# Patient Record
Sex: Female | Born: 1935 | Race: White | Hispanic: No | Smoking: Never smoker
Health system: Southern US, Community
[De-identification: ages and names within clinical notes are randomized; demographics above are authoritative.]

---

## 2004-08-25 ENCOUNTER — Other Ambulatory Visit: Payer: Self-pay

## 2004-08-25 ENCOUNTER — Emergency Department: Payer: Self-pay | Admitting: Emergency Medicine

## 2005-02-04 ENCOUNTER — Ambulatory Visit: Payer: Self-pay | Admitting: Psychiatry

## 2005-06-20 ENCOUNTER — Ambulatory Visit: Payer: Self-pay | Admitting: Family Medicine

## 2005-12-02 ENCOUNTER — Other Ambulatory Visit: Payer: Self-pay

## 2005-12-03 ENCOUNTER — Inpatient Hospital Stay: Payer: Self-pay | Admitting: Internal Medicine

## 2005-12-27 ENCOUNTER — Emergency Department: Payer: Self-pay | Admitting: General Practice

## 2006-02-04 ENCOUNTER — Ambulatory Visit: Payer: Self-pay | Admitting: Nephrology

## 2006-04-17 ENCOUNTER — Other Ambulatory Visit: Payer: Self-pay

## 2006-04-17 ENCOUNTER — Emergency Department: Payer: Self-pay | Admitting: Emergency Medicine

## 2006-07-03 ENCOUNTER — Ambulatory Visit: Payer: Self-pay | Admitting: Family Medicine

## 2007-05-01 ENCOUNTER — Emergency Department: Payer: Self-pay | Admitting: General Practice

## 2007-09-02 ENCOUNTER — Ambulatory Visit: Payer: Self-pay | Admitting: Family Medicine

## 2008-09-13 ENCOUNTER — Ambulatory Visit: Payer: Self-pay | Admitting: Family Medicine

## 2008-10-10 ENCOUNTER — Emergency Department: Payer: Self-pay | Admitting: Emergency Medicine

## 2010-07-17 ENCOUNTER — Ambulatory Visit: Payer: Self-pay | Admitting: Family Medicine

## 2011-04-04 ENCOUNTER — Emergency Department: Payer: Self-pay | Admitting: Emergency Medicine

## 2012-08-25 ENCOUNTER — Ambulatory Visit: Payer: Self-pay | Admitting: Nephrology

## 2012-09-04 ENCOUNTER — Ambulatory Visit: Payer: Self-pay | Admitting: Nephrology

## 2012-10-04 ENCOUNTER — Ambulatory Visit: Payer: Self-pay | Admitting: Nephrology

## 2013-01-14 ENCOUNTER — Ambulatory Visit: Payer: Self-pay | Admitting: Primary Care

## 2013-01-23 ENCOUNTER — Emergency Department: Payer: Self-pay | Admitting: Emergency Medicine

## 2013-01-23 LAB — CBC WITH DIFFERENTIAL/PLATELET
Basophil %: 0.7 %
Eosinophil %: 0.8 %
HCT: 42.1 % (ref 35.0–47.0)
HGB: 13.8 g/dL (ref 12.0–16.0)
Lymphocyte #: 1 10*3/uL (ref 1.0–3.6)
Lymphocyte %: 14.4 %
MCH: 30.4 pg (ref 26.0–34.0)
MCHC: 32.7 g/dL (ref 32.0–36.0)
Monocyte #: 0.4 x10 3/mm (ref 0.2–0.9)
Neutrophil #: 5.4 10*3/uL (ref 1.4–6.5)
RBC: 4.53 10*6/uL (ref 3.80–5.20)

## 2013-01-23 LAB — COMPREHENSIVE METABOLIC PANEL
Anion Gap: 4 — ABNORMAL LOW (ref 7–16)
Chloride: 102 mmol/L (ref 98–107)
Co2: 29 mmol/L (ref 21–32)
EGFR (African American): 35 — ABNORMAL LOW
Osmolality: 282 (ref 275–301)
Potassium: 3.8 mmol/L (ref 3.5–5.1)
SGPT (ALT): 24 U/L (ref 12–78)
Sodium: 135 mmol/L — ABNORMAL LOW (ref 136–145)
Total Protein: 7.2 g/dL (ref 6.4–8.2)

## 2013-01-23 LAB — URINALYSIS, COMPLETE
Bacteria: NONE SEEN
Bilirubin,UR: NEGATIVE
Blood: NEGATIVE
Glucose,UR: >=500 mg/dL (ref 0–75)
Hyaline Cast: 1
Ketone: NEGATIVE
Nitrite: NEGATIVE
Ph: 7 (ref 4.5–8.0)
RBC,UR: 1 /HPF (ref 0–5)
Squamous Epithelial: 1
WBC UR: 3 /HPF (ref 0–5)

## 2013-02-02 ENCOUNTER — Emergency Department: Payer: Self-pay | Admitting: Emergency Medicine

## 2013-04-01 ENCOUNTER — Ambulatory Visit: Payer: Self-pay | Admitting: Vascular Surgery

## 2013-04-07 ENCOUNTER — Ambulatory Visit: Payer: Self-pay | Admitting: Vascular Surgery

## 2013-04-07 LAB — BASIC METABOLIC PANEL
Anion Gap: 4 — ABNORMAL LOW (ref 7–16)
BUN: 24 mg/dL — ABNORMAL HIGH (ref 7–18)
Calcium, Total: 9.5 mg/dL (ref 8.5–10.1)
Chloride: 106 mmol/L (ref 98–107)
Co2: 29 mmol/L (ref 21–32)
EGFR (African American): 39 — ABNORMAL LOW
Osmolality: 286 (ref 275–301)
Potassium: 3.7 mmol/L (ref 3.5–5.1)
Sodium: 139 mmol/L (ref 136–145)

## 2013-04-20 ENCOUNTER — Ambulatory Visit: Payer: Self-pay | Admitting: Vascular Surgery

## 2013-04-20 LAB — CBC
HCT: 39.6 % (ref 35.0–47.0)
HGB: 13.5 g/dL (ref 12.0–16.0)
MCH: 31.2 pg (ref 26.0–34.0)
MCV: 92 fL (ref 80–100)
RBC: 4.31 10*6/uL (ref 3.80–5.20)

## 2013-04-20 LAB — BASIC METABOLIC PANEL
BUN: 29 mg/dL — ABNORMAL HIGH (ref 7–18)
Calcium, Total: 9.5 mg/dL (ref 8.5–10.1)
Chloride: 105 mmol/L (ref 98–107)
Creatinine: 1.78 mg/dL — ABNORMAL HIGH (ref 0.60–1.30)
EGFR (Non-African Amer.): 26 — ABNORMAL LOW
Glucose: 235 mg/dL — ABNORMAL HIGH (ref 65–99)
Osmolality: 289 (ref 275–301)

## 2013-04-28 ENCOUNTER — Inpatient Hospital Stay: Payer: Self-pay | Admitting: Vascular Surgery

## 2013-04-29 LAB — CBC WITH DIFFERENTIAL/PLATELET
Basophil #: 0 10*3/uL (ref 0.0–0.1)
Basophil %: 0.3 %
Eosinophil #: 0 10*3/uL (ref 0.0–0.7)
Eosinophil %: 0.4 %
HCT: 36.8 % (ref 35.0–47.0)
Lymphocyte #: 1 10*3/uL (ref 1.0–3.6)
Lymphocyte %: 10.1 %
MCH: 30.7 pg (ref 26.0–34.0)
Monocyte #: 0.6 x10 3/mm (ref 0.2–0.9)
Monocyte %: 6.2 %
Platelet: 259 10*3/uL (ref 150–440)
WBC: 9.4 10*3/uL (ref 3.6–11.0)

## 2013-04-29 LAB — APTT: Activated PTT: 34.6 secs (ref 23.6–35.9)

## 2013-04-29 LAB — PROTIME-INR: Prothrombin Time: 14.4 secs (ref 11.5–14.7)

## 2013-04-29 LAB — BASIC METABOLIC PANEL
BUN: 13 mg/dL (ref 7–18)
Calcium, Total: 8.3 mg/dL — ABNORMAL LOW (ref 8.5–10.1)
Co2: 28 mmol/L (ref 21–32)
EGFR (Non-African Amer.): 44 — ABNORMAL LOW
Osmolality: 281 (ref 275–301)
Potassium: 3.3 mmol/L — ABNORMAL LOW (ref 3.5–5.1)
Sodium: 139 mmol/L (ref 136–145)

## 2013-04-30 LAB — PATHOLOGY REPORT

## 2013-08-15 LAB — COMPREHENSIVE METABOLIC PANEL
Alkaline Phosphatase: 92 U/L (ref 50–136)
Bilirubin,Total: 0.4 mg/dL (ref 0.2–1.0)
Calcium, Total: 9.4 mg/dL (ref 8.5–10.1)
Chloride: 105 mmol/L (ref 98–107)
Co2: 27 mmol/L (ref 21–32)
EGFR (Non-African Amer.): 29 — ABNORMAL LOW
Glucose: 173 mg/dL — ABNORMAL HIGH (ref 65–99)
Osmolality: 282 (ref 275–301)
Potassium: 3.9 mmol/L (ref 3.5–5.1)
SGPT (ALT): 19 U/L (ref 12–78)

## 2013-08-15 LAB — CBC
HGB: 13.4 g/dL (ref 12.0–16.0)
MCH: 31 pg (ref 26.0–34.0)
MCHC: 34.2 g/dL (ref 32.0–36.0)
MCV: 91 fL (ref 80–100)

## 2013-08-15 LAB — URINALYSIS, COMPLETE
Bilirubin,UR: NEGATIVE
Glucose,UR: 150 mg/dL (ref 0–75)
Ketone: NEGATIVE
Leukocyte Esterase: NEGATIVE
RBC,UR: <1 /HPF (ref 0–5)
Squamous Epithelial: 1
WBC UR: <1 /HPF (ref 0–5)

## 2013-08-15 LAB — TROPONIN I: Troponin-I: <0.02 ng/mL

## 2013-08-16 ENCOUNTER — Inpatient Hospital Stay: Payer: Self-pay | Admitting: Internal Medicine

## 2013-08-16 LAB — LIPID PANEL
Cholesterol: 130 mg/dL (ref 0–200)
HDL Cholesterol: 51 mg/dL (ref 40–60)
VLDL Cholesterol, Calc: 27 mg/dL (ref 5–40)

## 2013-08-16 LAB — BASIC METABOLIC PANEL
Anion Gap: 9 (ref 7–16)
BUN: 26 mg/dL — ABNORMAL HIGH (ref 7–18)
Calcium, Total: 9.3 mg/dL (ref 8.5–10.1)
Chloride: 108 mmol/L — ABNORMAL HIGH (ref 98–107)
Co2: 24 mmol/L (ref 21–32)
EGFR (African American): 39 — ABNORMAL LOW
Glucose: 158 mg/dL — ABNORMAL HIGH (ref 65–99)

## 2013-08-16 LAB — CBC WITH DIFFERENTIAL/PLATELET
Basophil #: 0 10*3/uL (ref 0.0–0.1)
Eosinophil #: 0.1 10*3/uL (ref 0.0–0.7)
Eosinophil %: 1.5 %
HCT: 38.3 % (ref 35.0–47.0)
HGB: 13 g/dL (ref 12.0–16.0)
MCH: 30.7 pg (ref 26.0–34.0)
MCHC: 34 g/dL (ref 32.0–36.0)
MCV: 91 fL (ref 80–100)
Monocyte #: 0.4 x10 3/mm (ref 0.2–0.9)
Monocyte %: 8.1 %
Neutrophil %: 72.3 %
Platelet: 250 10*3/uL (ref 150–440)
RBC: 4.23 10*6/uL (ref 3.80–5.20)
RDW: 15 % — ABNORMAL HIGH (ref 11.5–14.5)

## 2014-05-18 ENCOUNTER — Ambulatory Visit: Payer: Self-pay | Admitting: Primary Care

## 2014-05-19 ENCOUNTER — Encounter: Payer: Self-pay | Admitting: Internal Medicine

## 2014-06-04 ENCOUNTER — Encounter: Payer: Self-pay | Admitting: Internal Medicine

## 2014-07-05 ENCOUNTER — Encounter: Payer: Self-pay | Admitting: Internal Medicine

## 2014-08-04 ENCOUNTER — Encounter: Payer: Self-pay | Admitting: Internal Medicine

## 2015-02-24 NOTE — Discharge Summary (Signed)
PATIENT NAME:  Joan Cisneros, Joan Cisneros MR#:  [id number] DATE OF BIRTH:  [birth year]  DISCHARGING PHYSICIAN: Enid Baas, M.D.   PRIMARY CARE PHYSICIAN: Sandrea Hughs, N.P.  CONSULTATIONS IN THE HOSPITAL: None.   DISCHARGE DIAGNOSES:  1.  Right facial droop, possibly transient ischemic attack, which is resolved.  2.  Hypertension.  3.  Diabetes.  4.  Chronic kidney disease.  5.  Hypothyroidism.  6.  Peripheral vascular disease.  7.  Status post recent right carotid endarterectomy.   DISCHARGE HOME MEDICATIONS:  Nortriptyline 25 mg p.o. at bedtime.  Plavix 75 daily.  Norvasc 2.5 mg p.o. daily.  Lovastatin 40 mg p.o. daily.  Actos 30 mg p.o. daily.  Glipizide 20 mg p.o. b.i.d.  Hydrochlorothiazide 25 mg p.o. daily.  Meloxicam 7.5 mg p.o. daily p.r.n. for pain.  Colace 100 mg p.o. b.i.d. p.r.n. for constipation.  Aspirin 81 mg p.o. daily.  Synthroid 88 mcg p.o. daily.   DISCHARGE DIET: Low-sodium and ADA diet.   DISCHARGE ACTIVITY: As tolerated.   FOLLOWUP INSTRUCTIONS: PCP follow-up in 1 to 2 weeks.   LABORATORIES AND IMAGING STUDIES PRIOR TO DISCHARGE: WBC 4.7, hemoglobin 13.0, hematocrit 38.3, platelet count 215.   Sodium 141, potassium 3.5, chloride 108, bicarb 24, BUN 26, creatinine 1.45, glucose 158 and calcium of 9.3.   HbA1c 6.9. TSH was low at 0.149 and free T4 was slightly elevated at 1.47.   Ultrasound carotid bilaterally revealing no evidence of any hemodynamically significant stenosis. Small calcified plaque on the right side is present secondary to previous endarterectomy. On the left side also small to moderate amount of plaque is seen diffusely. CT of the head without contrast on admission showing no evidence of any ischemic or hemorrhagic infarction.   Urinalysis was negative for any infection.   MRI of the brain without contrast showing no focal abnormal. No evidence of acute ischemia. Echo Doppler revealing normal LV ejection fraction, EF of 60% to 65%.  Impaired relaxation pattern of LV diastolic filling noted.   BRIEF HOSPITAL COURSE: Joan Cisneros is a pleasant 81 year old Caucasian female with past medical history significant for hypertension, diabetes, chronic kidney disease, who had a recent right carotid endarterectomy done for high-grade stenosis. She presents to the hospital for a transient episode of weakness and right facial droop.  1.  Possible TIA. Was admitted to telemetry. Neuro checks remained stable. The patient's symptoms resolved in less than 24 hours. MRI was negative for any infarct. She was already taking aspirin and statin which were continued. Her carotid Doppler showed post surgical changes on the right side and the left side, small to moderate plaque being present. She was recommended to follow up with vascular as an outpatient for her left carotid small to moderate plaque. The patient was already on Plavix which was continued at this time. The patient had an echocardiogram which seemed to be normal so no medication changes were done.  2.  Hypothyroidism. The patient was on Synthroid; however, her TSH was low and T4 was  slightly elevated, so her Synthroid dose was decreased from 100 mcg to 88 mcg prior to discharge.  3.  Hypertension and diabetes were stable during the hospital course, so her home medications were continued without any changes. Her course has been otherwise uneventful in the hospital.   DISCHARGE CONDITION: Stable.   DISCHARGE DISPOSITION: Home.   TIME SPENT ON DISCHARGE: 40 minutes.    ____________________________ Enid Baas, MD rk:np D: 08/18/2013 15:14:28 ET T: 08/18/2013 19:11:35 ET JOB#: [id number]  cc: Enid Baasadhika Yekaterina Escutia, MD, <Dictator> Enid BaasADHIKA Shenicka Sunderlin MD ELECTRONICALLY SIGNED 08/21/2013 12:04

## 2015-02-24 NOTE — Op Note (Signed)
PATIENT NAME:  Joan Cisneros, [name] MR#:  [id number] DATE OF BIRTH:  26-Dec-1930  DATE OF PROCEDURE:  04/07/2013  PREOPERATIVE DIAGNOSES:  1.  Carotid artery stenosis.  2.  Syncope.  3.  Chronic kidney disease precluding CT angiogram.  4.  Hypertension.  5.  Hyperlipidemia.  6.  Diabetes.   POSTOPERATIVE DIAGNOSES: 1.  Carotid artery stenosis.  2.  Syncope.  3.  Chronic kidney disease precluding CT angiogram.  4.  Hypertension.  5.  Hyperlipidemia.  6.  Diabetes.   PROCEDURES:  1.  Catheter placement to right common carotid artery from right femoral approach.  2.  Thoracic aortogram and selective cervical and cerebral right carotid angiogram.  3.  StarClose closure device, right femoral artery.   SURGEON: Annice NeedyJason S Tripp Goins, M.D.   ANESTHESIA: Local with moderate conscious sedation.   ESTIMATED BLOOD LOSS: Minimal.   FLUOROSCOPY TIME: Approximately 2 minutes.   CONTRAST USED: 37 mL.   INDICATION FOR PROCEDURE: This is an 81 year old white female with ultrasound suggesting high-grade right carotid artery stenosis. She could not have a CT scan due to her diminished creatinine clearance and a catheter-based angiogram performed with a limited amount of contrast is performed for further evaluation to delineate the best treatment options. Risks and benefits were discussed. Informed consent was obtained.   DESCRIPTION OF PROCEDURE: The patient is brought to the vascular and interventional radiology suite. Groins were sterilely prepped and draped, and a sterile surgical field was created. The right femoral head was localized with fluoroscopy and the right femoral artery was accessed without difficulty with a Seldinger needle. A J-wire was placed and a 5-French sheath was placed over the wire. Pigtail catheter was placed in the ascending aorta and LAO projection and thoracic aortogram was performed. This demonstrated an approximately 70% stenosis at the origin of the left common carotid artery. The  visualization of the left cervical carotid artery did not appear to show any flow-limiting stenosis and I did not want to selectively cannulate this; not only to avoid cannulation of the proximal lesion and its attended risk of thromboembolization, but also to limit contrast due to her chronic kidney disease. I then used a Headhunter catheter to selectively cannulate the innominate artery without difficulty and advanced into the right common carotid artery. Cervical and cerebral right carotid angiography was then performed. This demonstrated an approximately 90% right ICA stenosis in the proximal portion of the ICA. This was a reasonably long lesion that was 15 to 20 mm in length in the ICA. The intracerebral flow showed a brisk filling of the middle cerebral artery with no filling of the anterior cerebral artery. At this point, I elected to terminate the procedure. The diagnostic catheter was removed. AP and lateral cerebral shots and LAO, RAO and lateral cervical shots were performed for evaluation. StarClose closure device was then deployed in the right femoral artery with excellent hemostatic result. The patient tolerated the procedure well and was taken to the recovery room in stable condition.    ____________________________ Annice NeedyJason S. Sema Stangler, MD jsd:aw D: 04/07/2013 11:13:19 ET T: 04/07/2013 11:45:10 ET JOB#: [id number]  cc: Annice NeedyJason S. Saman Giddens, MD, <Dictator> Annice NeedyJASON S Anagabriela Jokerst MD ELECTRONICALLY SIGNED 04/14/2013 10:58

## 2015-02-24 NOTE — Discharge Summary (Signed)
PATIENT NAME:  Joan Cisneros, Joan Cisneros MR#:  [id number] DATE OF BIRTH:  [birth year]  DATE OF ADMISSION:  04/28/2013 DATE OF DISCHARGE:  04/29/2013  ADMITTING DIAGNOSES: High-grade right carotid artery stenosis, moderate left common carotid artery stenosis, hypertension, diabetes, chronic kidney disease.   DISCHARGE DIAGNOSES: High-grade right carotid artery stenosis, moderate left common carotid artery stenosis, hypertension, diabetes, chronic kidney disease.   CONSULTS: None.   HOSPITAL PROCEDURES: Right carotid endarterectomy with CorMatrix arterial reconstruction of the carotid artery.   BRIEF HISTORY: The patient is an 81 year old white female who was found to have high-grade carotid artery stenosis, also with stenosis of the left common carotid artery. She did not have any neuro symptoms prior to the surgery.   HOSPITAL COURSE: The patient was admitted through same day surgery and taken to the Operating Room for right carotid endarterectomy, which was performed by Dr. Wyn Quakerew. She did well following the procedure and was admitted to the Critical Care Unit where she was closely monitored. She did well after the procedure. She had no neurologic changes. Blood pressure was well controlled. On postop day 1, she was tolerating diet, able to ambulate without issues, and again without any neurologic changes. At this time, she was ready for discharge.   DISCHARGE INSTRUCTIONS: Low-fat, low-cholesterol diet. No exertional activity. Follow up for wound check in 2 weeks.   DISCHARGE MEDICATIONS: Please see the patient's chart, which does include aspirin and Plavix. She was given Cisneros prescription for Norco as well. She also was discharged on lovastatin, which she had previously taken at home. For more information, please see the patient's chart.   ____________________________ Hoyle Sauerhelsea N. Dorice Stiggers, PA-C cnh:jm D: 05/03/2013 17:36:36 ET T: 05/03/2013 20:56:00 ET JOB#: [id number]  cc: Hoyle Sauerhelsea N. Clytee Heinrich, PA-C,  <Dictator> Anitha Kreiser N Cattleya Dobratz PA ELECTRONICALLY SIGNED 05/14/2013 17:50

## 2015-02-24 NOTE — H&P (Signed)
PATIENT NAME:  Joan Cisneros, Zameria A MR#:  [id number] DATE OF BIRTH:  1931/05/25  DATE OF ADMISSION:  08/15/2013  PRIMARY CARE PHYSICIAN:  Sandrea HughsJessica Rubio, NP   REFERRING PHYSICIAN:  Dr. Lenard LancePaduchowski  CHIEF COMPLAINT:  Right facial droop, weakness and dysarthria.   HISTORY OF PRESENT ILLNESS: The patient is an 81 year old, pleasant Caucasian female, with past medical history of hypertension, diabetes mellitus, chronic kidney disease and hypothyroidism, with a recent history of high-grade right carotid artery stenosis, status post right carotid endarterectomy. She is presenting to the ER with a chief complaint of generalized weakness associated with dysarthria and right facial droop since this morning. According to the granddaughter, patient was feeling weak and tired, and called her granddaughter. By the time the granddaughter went there, patient was quite pale and shivering. The granddaughter also noted right facial droop. The patient's speech was not quite clear. She denies any difficulty swallowing. Denies any headache or blurry vision. She had right-sided weakness transiently, which has resolved. Eventually, dysarthria is also completely resolved. In the ER, patient still had right facial droop and generalized weakness. Complaining of dizziness, but denies any loss of consciousness. Denies any chest pain or shortness of breath. CAT scan of the head has revealed no acute findings. Hospitalist team is called to admit the patient, as patient has a recent history of high-grade right carotid artery stenosis, and got carotid endarterectomy done in June 2014. The patient also does have moderate left carotid artery stenosis, which is concerning for the patient's presenting picture.  PAST MEDICAL HISTORY: Hypertension, diabetes mellitus, chronic renal insufficiency, hypothyroidism.   PAST SURGICAL HISTORY:  Right carotid endarterectomy.  ALLERGIES:  She has no known drug allergies.   PSYCHOSOCIAL HISTORY:  Lives at home. Lives alone. Denies any history of smoking, alcohol or drugs.   FAMILY HISTORY: Dad deceased with heart attack at age 79. Mom was healthy and lived until age 79.   HOME MEDICATIONS:  Plavix 75 mg once daily, pioglitazone 30 mg once daily, nortriptyline 25 mg once daily, lovastatin 40 mg once daily, levothyroxine  100 mcg once daily, hydrochlorothiazide 25 mg once daily as-needed basis, Glipizide 10 mg 2 tablets p.o. 2 times a day, aspirin 81 mg once daily, amlodipine 2.5 mg once daily.   REVIEW OF SYSTEMS:  CONSTITUTIONAL: Denies any fever, weight loss or weight gain. Complaining of fatigue.  EYES: Denies any blurry vision or eye pain.  ENT:  Denies any epistaxis or ear discharge.  RESPIRATORY:  Denies cough, COPD.   CARDIOVASCULAR: Denies chest pain, syncope. Complaining of dizziness.  GASTROINTESTINAL: Denies nausea, vomiting, diarrhea.  GENITOURINARY: No dysuria or hematuria.  GYN:  The patient denies breast mass or vaginal discharge.  ENDOCRINE: Denies polyuria, nocturia. Has chronic diabetes mellitus and hypothyroidism. HEMATOLOGIC AND LYMPHATIC: Denies anemia, easy bruising or bleeding.  INTEGUMENT: No acne, rash, lesions.  MUSCULOSKELETAL: No joint pain in the neck. Complaining of chronic right knee pain.  NEUROLOGIC: Denies any vertigo. Complaining of weakness and dysarthria, which has resolved. Still complaining of right facial droop. Denies any numbness.  PSYCHIATRIC: Denies any anxiety , depression  PHYSICAL EXAMINATION: VITAL SIGNS: Temperature 97.6, pulse 72, respirations 18, blood pressure 174/66, pulse ox 99%.  GENERAL APPEARANCE: Not in acute distress. Moderately built and nourished.  HEENT: Normocephalic, atraumatic. Pupils are equally reacting to light and accommodation. No scleral icterus. No conjunctival injection. Extraocular movements are intact. No sinus tenderness. No postnasal drip. Moist mucous membranes. Positive right facial droop.  NECK: Supple. No  JVD. No  thyromegaly. Range of motion is intact.  LUNGS: Clear to auscultation bilaterally. No accessory muscle use. There is no anterior chest wall tenderness on palpation.  CARDIAC:  S1, S2 normal. Regular rate and rhythm. No gallops. No clicks.  GASTROINTESTINAL: Soft. Bowel sounds are positive in all 4 quadrants. Nontender, nondistended. No hepatosplenomegaly. No masses felt.  NEUROLOGIC: Awake, alert, oriented x 3. Cranial nerves II to XII are grossly intact. No cerebellar signs. Finger-nose test is normal. No pronator drift. Upper and lower extremities motor and sensory are intact.  EXTREMITIES: No edema. No cyanosis. No clubbing. Reflexes are 2+.  MUSCULOSKELETAL: Positive right knee tenderness, which is chronic in nature. Otherwise no joint effusions or tenderness in other joints.  SKIN: Warm to touch. Decreased turgor because of age. No rashes. No lesions.  PSYCHIATRIC:  Normal mood and affect.  PULSES:  Distal pulses, dorsalis pedis and posterior tibialis are intact.   LABS AND IMAGING STUDIES: CAT scan of the head: No acute findings. A 12-lead EKG: Normal sinus rhythm at 81 beats per minute. Normal PR and QRS intervals. No ST-T wave changes. LFTs are normal. Troponin less than 0.02. CBC is normal. Urinalysis is normal. Glucose 173, BUN 24, creatinine 1.63, sodium 137, potassium 3.9, chloride 105, CO2 of 27. GFR 29, anion gap 5, serum osmolality 282, calcium 9.4.   ASSESSMENT AND PLAN: An 81 year old Caucasian female presenting to the ER with a chief complaint of generalized weakness, also with right facial droop and dysarthria since this morning. Will be admitted with the following assessment and plan:  Transient ischemic attack versus cerebrovascular accident, with history of moderate left carotid artery stenosis, status post right carotid endarterectomy in June 2014. Will admit her to telemetry.   Stroke workup with carotid Doppler on the left side, echocardiogram and MRI of the brain.    Will continue her home medications aspirin, Plavix and statin.   Will get neuro checks.   A bedside swallow evaluation by nursing. If swallowing function is intact, will start her on a low salt diabetic diet.   History of diabetes mellitus. Will resume her home medications if bedside swallow evaluation is normal. Will continue sliding scale insulin while patient is in the hospital for stress-induced hyperglycemia.   Hypertension. Resume her home medications.   Hypothyroidism. Continue Synthroid, if p.o. medications are resumed.    Will provide gastrointestinal and deep vein thrombosis prophylaxis.   She is FULL CODE. Granddaughter is her medical power of attorney.   Diagnosis and plan of care was discussed in detail with the patient and her granddaughter at bedside. They both verbalized understanding of the plan.   Total time spent on admission was 45 minutes.      ____________________________ Ramonita Lab, MD ag:mr D: 08/15/2013 19:25:51 ET T: 08/15/2013 19:45:52 ET JOB#: [id number]  cc: Ramonita Lab, MD, <Dictator> Sandrea Hughs, NP  Ramonita Lab MD ELECTRONICALLY SIGNED 09/03/2013 1:36

## 2015-02-24 NOTE — Op Note (Signed)
PATIENT NAME:  Joan Cisneros, Joan Cisneros MR#:  [id number] DATE OF BIRTH:  06-08-1931  DATE OF PROCEDURE:  04/28/2013  PREOPERATIVE DIAGNOSES:  1.  High-grade right carotid artery stenosis.  2.  Moderate left common carotid artery stenosis.  3.  Hypertension.  4.  Diabetes.  5.  Chronic kidney disease.   POSTOPERATIVE DIAGNOSES: 1.  High-grade right carotid artery stenosis.  2.  Moderate left common carotid artery stenosis.  3.  Hypertension.  4.  Diabetes.  5.  Chronic kidney disease.   PROCEDURE: Right carotid endarterectomy with CorMatrix arterial reconstruction of carotid artery.   SURGEON: Festus BarrenJason Trenity Pha, M.D.   ANESTHESIA: General.   ESTIMATED BLOOD LOSS: Approximately 50 mL.   INDICATION FOR PROCEDURE: This is an 81 year old white female with multiple medical issues. She was found to have carotid artery stenosis. She did not have clear focal symptoms, but she had Cisneros very high-grade right carotid artery stenosis of about 90%. She had some stenosis in her left common carotid artery at its origin of about 70%. Theses were seen on angiogram. Angiogram was done rather than CT scan due to her chronic kidney disease. For stroke risk reduction, we discussed options. She decided to proceed with right carotid endarterectomy. Risks and benefits were discussed including stroke, cardiopulmonary complications, bleeding, infection and cranial nerve injury. She desired to proceed.   DESCRIPTION OF PROCEDURE: The patient is brought to the operative suite, and after an adequate level of general anesthesia was obtained, the right neck and chest were sterilely prepped and draped and Cisneros sterile surgical field was created. She was placed in modified beach chair position. An incision was created along the anterior border of the sternocleidomastoid. It was dissected down to the platysma with electrocautery. Cisneros crossing vein was ligated with 0 silk ties. The facial vein was ligated and divided between silk ties, doubly  ligated on the jugular side. Cisneros couple of other crossing venous branches were ligated and divided between silk ties. This exposed the carotid artery. I encircled the common carotid artery, internal carotid artery, external carotid artery and superior thyroid artery with vessel loops. Control was pulled up on the vessel loops after the patient was systemically heparinized. This was allowed to circulate for about 5 minutes. An anterior arteriotomy was then created with an 11 blade and extended with Potts scissors. The Pruitt-[place] shunt was placed first in the internal carotid artery, flushed and de-aired, then in common carotid artery. Approximately 2 minutes elapsed from clamping to shunt placement.   I then performed endarterectomy in the usual fashion. The proximal endpoint was cut flush with tenotomy scissors. An eversion endarterectomy was performed on the external carotid artery and Cisneros nice feathered distal endpoint was treated with an internal carotid artery with gentle traction. The distal endpoint was tacked down with two 7-0 Prolene sutures and all loose flecks were removed and the vessel was locally heparinized. The CorMatrix extracellular patch was used to reconstruct the artery. The patch was cut and beveled distally and we ran approximately one half the length of the arteriotomy medially and laterally. The patch was then cut and beveled to an appropriate length proximally to match the arteriotomy, and Cisneros second 6-0 Prolene was started at the proximal endpoint. The medial suture line was run and tied together. The lateral suture line was run until the shunt needed to be removed. The shunt was then removed. The vessel was flushed in the internal, external and common carotid arteries and locally heparinized and then after completing  the arteriotomy with flushing through the external carotid artery and de-airing the vessels allowing several cardiac cycles to traverse up the external carotid artery prior to  release of the internal carotid artery. Approximately 2 to 3 minutes elapsed from the shunt removal to completion and restoration of flow through the internal carotid artery. Two 6-0 Prolene patches were used for hemostasis. Hemostasis was complete. The wound was then irrigated and Evicel was placed. The wound was then closed with 3 interrupted Vicryl sutures in the sternocleidomastoid space. The platysma was closed with Cisneros running 3-0 Vicryl and skin was closed with 4-0 Monocryl. Dermabond was placed as Cisneros dressing. The patient was then awakened from anesthesia and taken to the recovery room in stable condition, having tolerated the procedure well.    ____________________________ Annice Needy, MD jsd:aw D: 04/28/2013 10:11:25 ET T: 04/28/2013 11:12:13 ET JOB#: [id number]  cc: Annice Needy, MD, <Dictator> Sandrea Hughs, MD Annice Needy MD ELECTRONICALLY SIGNED 05/10/2013 13:17

## 2015-04-07 ENCOUNTER — Ambulatory Visit
Admission: RE | Admit: 2015-04-07 | Discharge: 2015-04-07 | Disposition: A | Discharge status: Home / Self Care | Payer: Medicare HMO | Source: Ambulatory Visit | Attending: Primary Care | Admitting: Primary Care

## 2015-04-07 ENCOUNTER — Other Ambulatory Visit: Payer: Self-pay | Admitting: Primary Care

## 2015-04-07 DIAGNOSIS — M85871 Other specified disorders of bone density and structure, right ankle and foot: Secondary | ICD-10-CM | POA: Diagnosis not present

## 2015-04-07 DIAGNOSIS — M79674 Pain in right toe(s): Secondary | ICD-10-CM | POA: Diagnosis present

## 2015-04-07 DIAGNOSIS — M869 Osteomyelitis, unspecified: Secondary | ICD-10-CM

## 2015-07-12 IMAGING — US US CAROTID DUPLEX BILAT
1 series · 13 of 24 positions shown · non-contrast
Comparison: none

REASON FOR EXAM: tia
COMMENTS:

[Series 1: us carotid duplex bilat · 0.09mm/px · 13 of 61 slices shown]
[im 1/61]
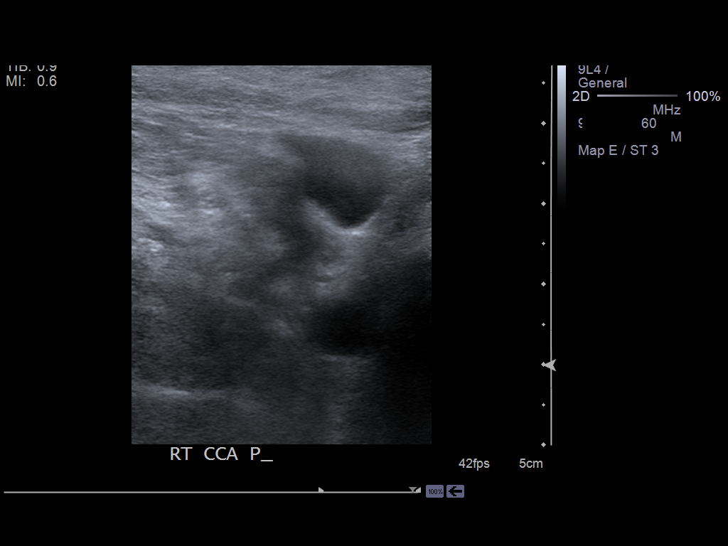
[im 6/61]
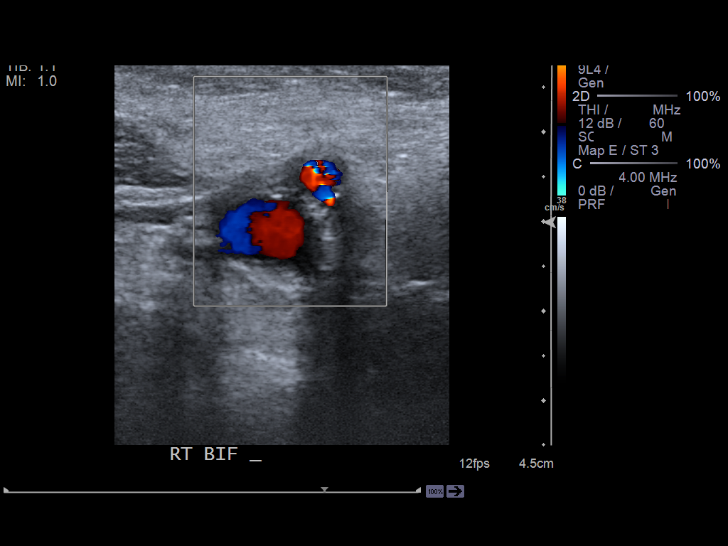
[im 11/61]
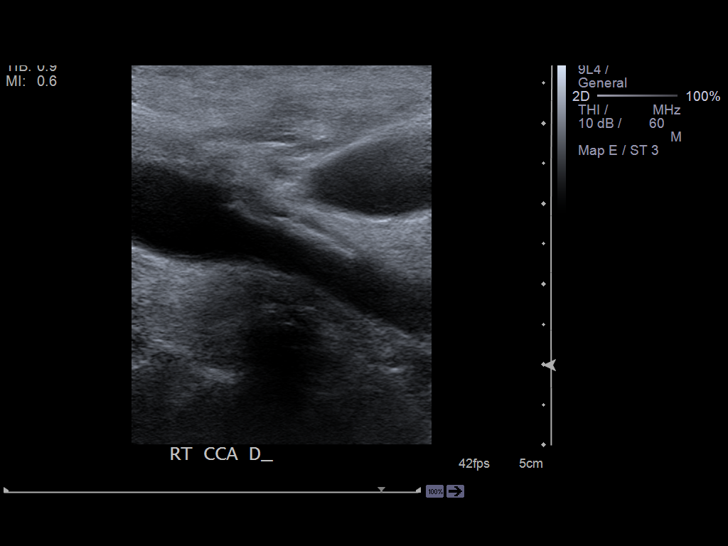
[im 16/61]
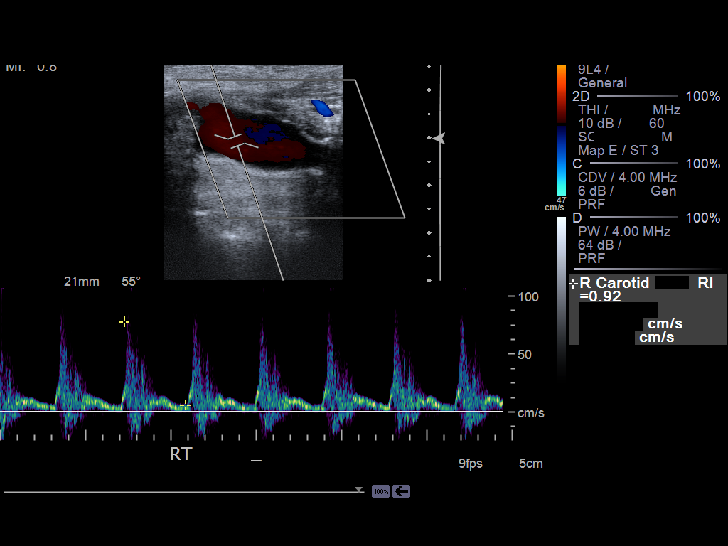
[im 21/61]
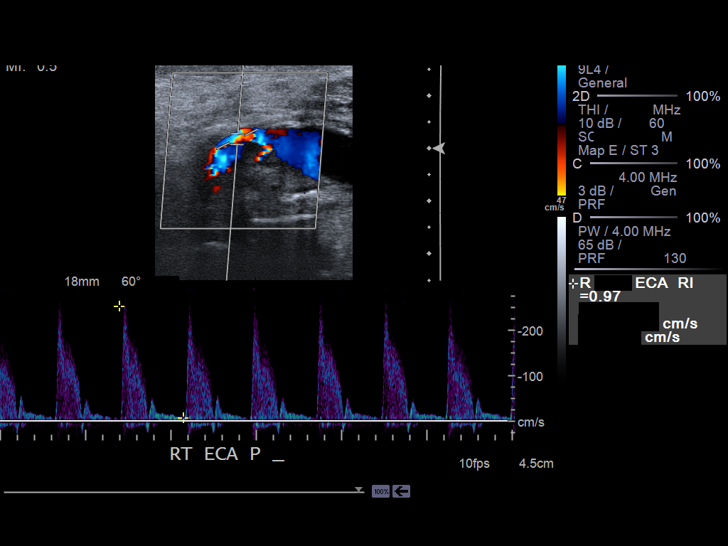
[im 27/61]
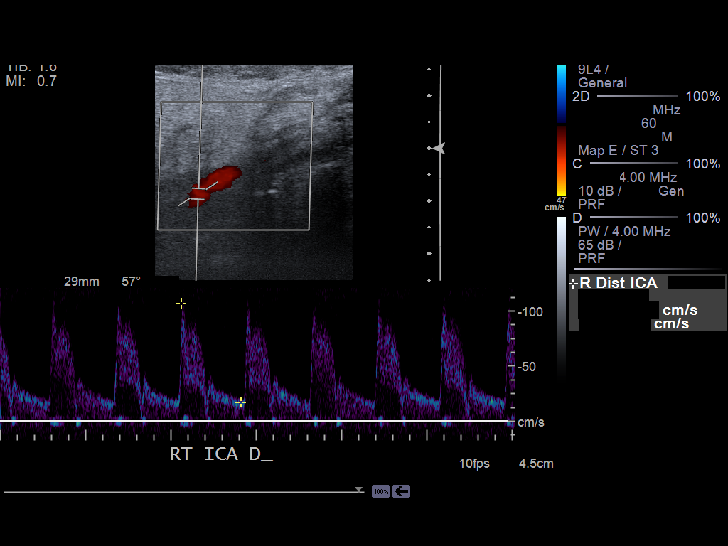
[im 32/61]
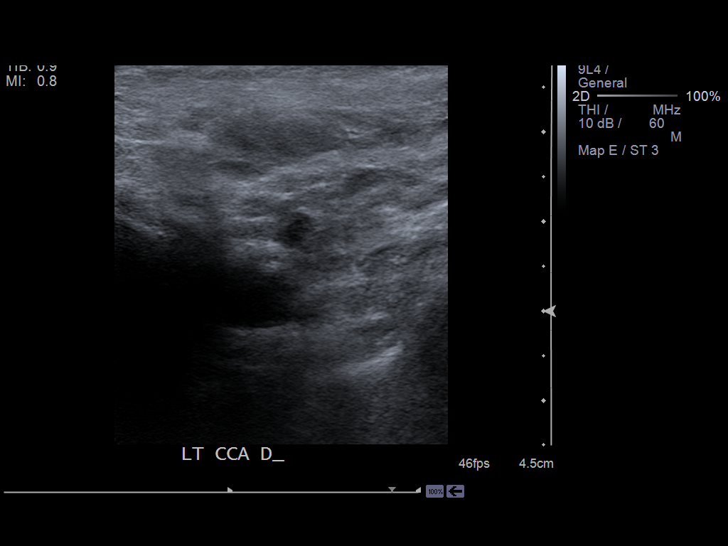
[im 34/61]
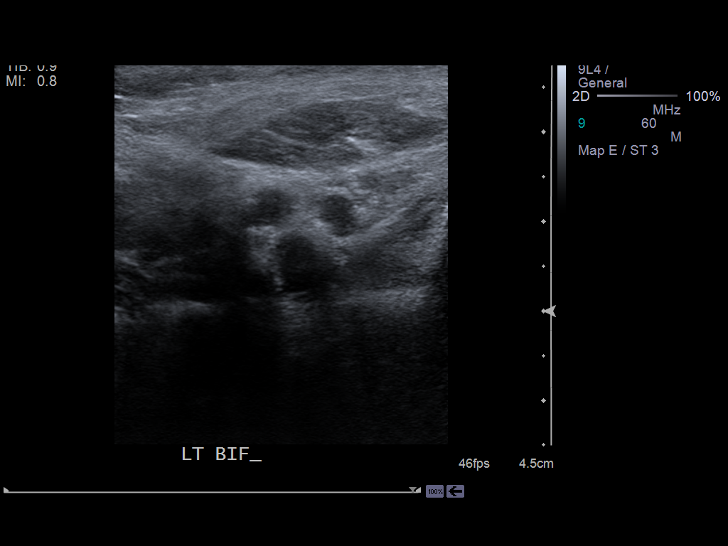
[im 40/61]
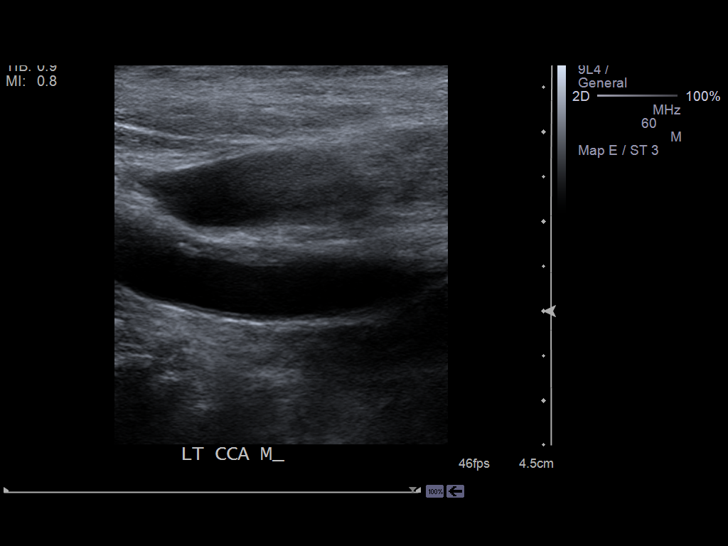
[im 45/61]
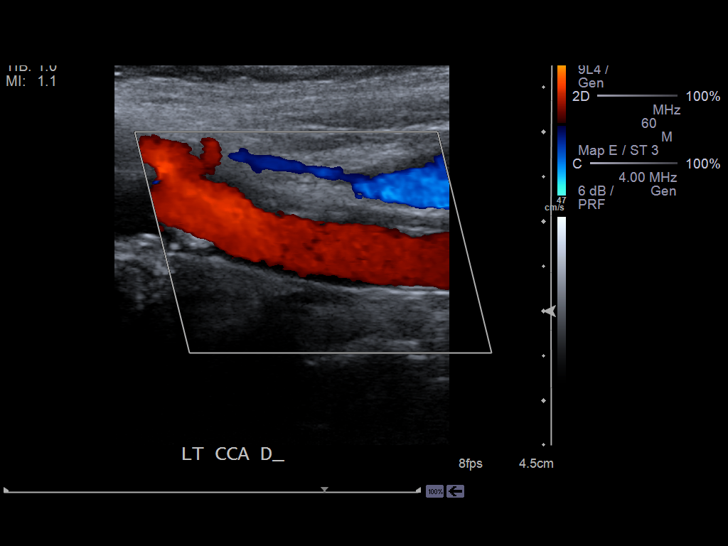
[im 50/61]
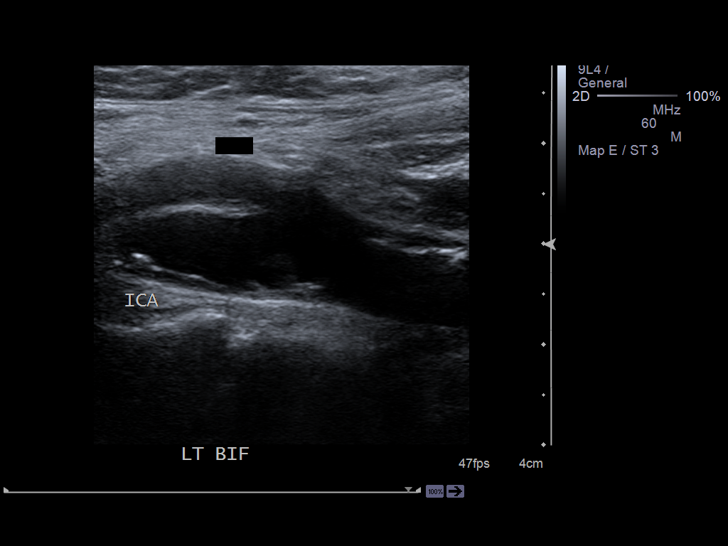
[im 55/61]
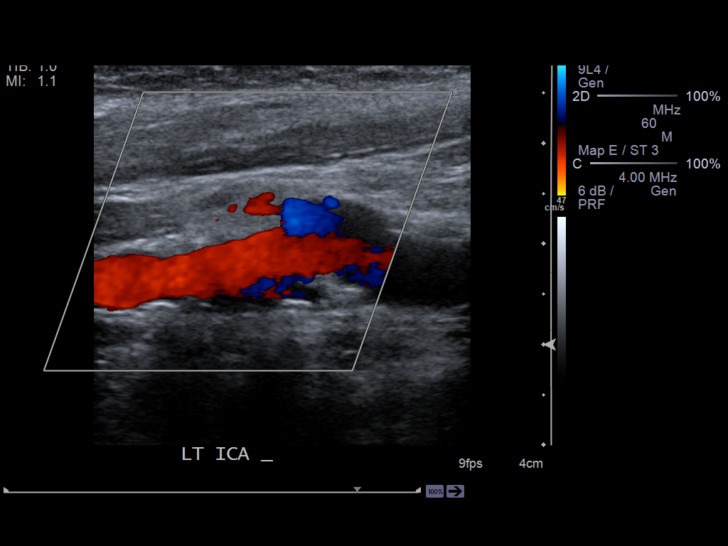
[im 61/61]
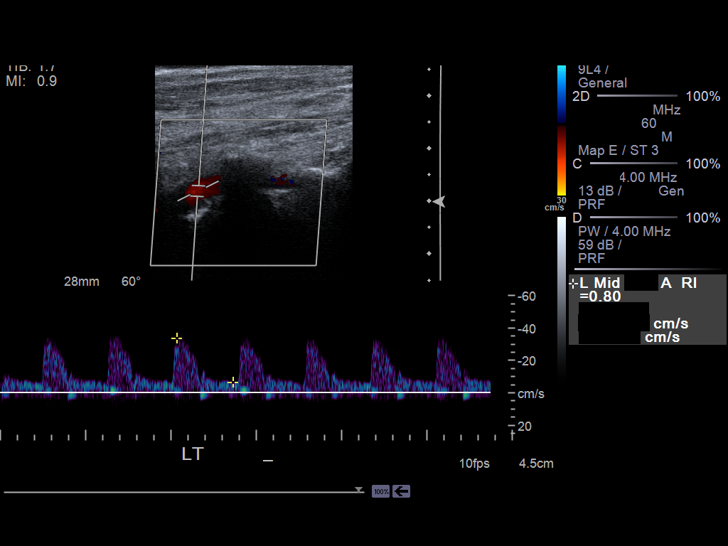

[13 of 24 positions shown; findings below may reference images not displayed]

PROCEDURE:     US  - US CAROTID DOPPLER BILATERAL  - August 15, 2013  [DATE]

RESULT:     Grayscale and color flow Doppler techniques were employed to
evaluate the cervical carotid arteries.

On the right there is calcified plaque in the distal CCA. On the left there
is soft and calcified plaque in the distal CCA as well as calcified plaque
at the bulb and proximal ICA. The waveform patterns and color flow images
are normal. On the right the peak internal carotid systolic velocity
measured 107 cm/sec and the peak common carotid velocity measured 70 cm/sec
corresponding to a normal ratio of 1.5. On the left the peak internal
carotid systolic velocity measured 82 cm/sec peak common carotid velocity
measured 87 cm/sec corresponding to a normal ratio of one. The vertebral
arteries are normal in flow direction bilaterally.
IMPRESSION: There is no evidence of a hemodynamically significant
carotid stenosis. There is a small amount of calcified plaque on the right
in the distal CCA likely secondary to previous endarterectomy. On the left a
small or moderate amount of plaque is seen diffusely.

A preliminary report was sent to the [HOSPITAL] the conclusion
of the study.

[REDACTED]

## 2018-09-28 ENCOUNTER — Other Ambulatory Visit: Payer: Self-pay | Admitting: Internal Medicine

## 2018-09-28 DIAGNOSIS — N939 Abnormal uterine and vaginal bleeding, unspecified: Secondary | ICD-10-CM

## 2018-10-06 ENCOUNTER — Ambulatory Visit
Admission: RE | Admit: 2018-10-06 | Discharge: 2018-10-06 | Disposition: A | Discharge status: Home / Self Care | Payer: Medicare (Managed Care) | Source: Ambulatory Visit | Attending: Internal Medicine | Admitting: Internal Medicine

## 2018-10-06 DIAGNOSIS — N939 Abnormal uterine and vaginal bleeding, unspecified: Secondary | ICD-10-CM

## 2019-01-16 ENCOUNTER — Inpatient Hospital Stay
Admission: EM | Admit: 2019-01-16 | Discharge: 2019-01-20 | DRG: 689 | Disposition: A | Discharge status: Skilled Nursing Facility | Payer: Medicare (Managed Care) | Attending: Internal Medicine | Admitting: Internal Medicine

## 2019-01-16 ENCOUNTER — Encounter: Payer: Self-pay | Admitting: Emergency Medicine

## 2019-01-16 ENCOUNTER — Other Ambulatory Visit: Payer: Self-pay

## 2019-01-16 ENCOUNTER — Emergency Department: Payer: Medicare (Managed Care)

## 2019-01-16 DIAGNOSIS — E876 Hypokalemia: Secondary | ICD-10-CM | POA: Diagnosis present

## 2019-01-16 DIAGNOSIS — Z79899 Other long term (current) drug therapy: Secondary | ICD-10-CM

## 2019-01-16 DIAGNOSIS — Z8249 Family history of ischemic heart disease and other diseases of the circulatory system: Secondary | ICD-10-CM | POA: Diagnosis not present

## 2019-01-16 DIAGNOSIS — E860 Dehydration: Secondary | ICD-10-CM | POA: Diagnosis present

## 2019-01-16 DIAGNOSIS — Z79890 Hormone replacement therapy (postmenopausal): Secondary | ICD-10-CM

## 2019-01-16 DIAGNOSIS — G92 Toxic encephalopathy: Secondary | ICD-10-CM | POA: Diagnosis present

## 2019-01-16 DIAGNOSIS — E039 Hypothyroidism, unspecified: Secondary | ICD-10-CM | POA: Diagnosis present

## 2019-01-16 DIAGNOSIS — Z681 Body mass index (BMI) 19 or less, adult: Secondary | ICD-10-CM

## 2019-01-16 DIAGNOSIS — Z66 Do not resuscitate: Secondary | ICD-10-CM | POA: Diagnosis present

## 2019-01-16 DIAGNOSIS — G9340 Encephalopathy, unspecified: Secondary | ICD-10-CM | POA: Diagnosis present

## 2019-01-16 DIAGNOSIS — N390 Urinary tract infection, site not specified: Principal | ICD-10-CM | POA: Diagnosis present

## 2019-01-16 DIAGNOSIS — R4182 Altered mental status, unspecified: Secondary | ICD-10-CM

## 2019-01-16 DIAGNOSIS — I10 Essential (primary) hypertension: Secondary | ICD-10-CM | POA: Diagnosis present

## 2019-01-16 DIAGNOSIS — L8990 Pressure ulcer of unspecified site, unspecified stage: Secondary | ICD-10-CM

## 2019-01-16 DIAGNOSIS — E43 Unspecified severe protein-calorie malnutrition: Secondary | ICD-10-CM

## 2019-01-16 DIAGNOSIS — F0390 Unspecified dementia without behavioral disturbance: Secondary | ICD-10-CM | POA: Diagnosis present

## 2019-01-16 DIAGNOSIS — Z955 Presence of coronary angioplasty implant and graft: Secondary | ICD-10-CM

## 2019-01-16 LAB — COMPREHENSIVE METABOLIC PANEL
ALT: 18 U/L (ref 0–44)
AST: 38 U/L (ref 15–41)
Albumin: 3.8 g/dL (ref 3.5–5.0)
Alkaline Phosphatase: 78 U/L (ref 38–126)
Anion gap: 11 (ref 5–15)
BILIRUBIN TOTAL: 1 mg/dL (ref 0.3–1.2)
BUN: 18 mg/dL (ref 8–23)
CO2: 27 mmol/L (ref 22–32)
Calcium: 11.1 mg/dL — ABNORMAL HIGH (ref 8.9–10.3)
Chloride: 100 mmol/L (ref 98–111)
Creatinine, Ser: 1 mg/dL (ref 0.44–1.00)
GFR calc Af Amer: 59 mL/min — ABNORMAL LOW (ref >60)
GFR calc non Af Amer: 51 mL/min — ABNORMAL LOW (ref >60)
Glucose, Bld: 144 mg/dL — ABNORMAL HIGH (ref 70–99)
Potassium: 3.1 mmol/L — ABNORMAL LOW (ref 3.5–5.1)
Sodium: 138 mmol/L (ref 135–145)
Total Protein: 7.1 g/dL (ref 6.5–8.1)

## 2019-01-16 LAB — URINALYSIS, COMPLETE (UACMP) WITH MICROSCOPIC
BILIRUBIN URINE: NEGATIVE
Glucose, UA: 150 mg/dL — AB
Ketones, ur: 5 mg/dL — AB
NITRITE: NEGATIVE
PROTEIN: 30 mg/dL — AB
Specific Gravity, Urine: 1.013 (ref 1.005–1.030)
WBC, UA: >50 WBC/hpf — ABNORMAL HIGH (ref 0–5)
pH: 6 (ref 5.0–8.0)

## 2019-01-16 LAB — CBC WITH DIFFERENTIAL/PLATELET
Abs Immature Granulocytes: 0.03 10*3/uL (ref 0.00–0.07)
Basophils Absolute: 0 10*3/uL (ref 0.0–0.1)
Basophils Relative: 0 %
Eosinophils Absolute: 0 10*3/uL (ref 0.0–0.5)
Eosinophils Relative: 0 %
HEMATOCRIT: 44.4 % (ref 36.0–46.0)
Hemoglobin: 14.5 g/dL (ref 12.0–15.0)
Immature Granulocytes: 0 %
Lymphocytes Relative: 7 %
Lymphs Abs: 0.7 10*3/uL (ref 0.7–4.0)
MCH: 29.2 pg (ref 26.0–34.0)
MCHC: 32.7 g/dL (ref 30.0–36.0)
MCV: 89.3 fL (ref 80.0–100.0)
Monocytes Absolute: 0.5 10*3/uL (ref 0.1–1.0)
Monocytes Relative: 5 %
Neutro Abs: 9.1 10*3/uL — ABNORMAL HIGH (ref 1.7–7.7)
Neutrophils Relative %: 88 %
Platelets: 301 10*3/uL (ref 150–400)
RBC: 4.97 MIL/uL (ref 3.87–5.11)
RDW: 13.6 % (ref 11.5–15.5)
WBC: 10.3 10*3/uL (ref 4.0–10.5)
nRBC: 0 % (ref 0.0–0.2)

## 2019-01-16 LAB — AMMONIA: Ammonia: <9 umol/L — ABNORMAL LOW (ref 9–35)

## 2019-01-16 LAB — TSH: TSH: 0.15 u[IU]/mL — ABNORMAL LOW (ref 0.350–4.500)

## 2019-01-16 LAB — CK: Total CK: 342 U/L — ABNORMAL HIGH (ref 38–234)

## 2019-01-16 MED ORDER — SODIUM CHLORIDE 0.9 % IV SOLN
1.0000 g | Freq: Once | INTRAVENOUS | Status: AC
  Administered 2019-01-16: 1 g via INTRAVENOUS
  Filled 2019-01-16: qty 10

## 2019-01-16 MED ORDER — LOSARTAN POTASSIUM 50 MG PO TABS
100.0000 mg | ORAL_TABLET | Freq: Every day | ORAL | Status: DC
  Administered 2019-01-16 – 2019-01-20 (×5): 100 mg via ORAL
  Filled 2019-01-16 (×5): qty 2

## 2019-01-16 MED ORDER — SODIUM CHLORIDE 0.9 % IV SOLN
1.0000 g | INTRAVENOUS | Status: DC
  Administered 2019-01-17: 1 g via INTRAVENOUS
  Filled 2019-01-16: qty 10
  Filled 2019-01-16: qty 1

## 2019-01-16 MED ORDER — ONDANSETRON HCL 4 MG PO TABS: 4.0000 mg | ORAL_TABLET | Freq: Four times a day (QID) | ORAL | Status: DC | PRN

## 2019-01-16 MED ORDER — POLYETHYLENE GLYCOL 3350 17 G PO PACK: 17.0000 g | PACK | Freq: Every day | ORAL | Status: DC | PRN

## 2019-01-16 MED ORDER — VITAMIN D (ERGOCALCIFEROL) 1.25 MG (50000 UNIT) PO CAPS: 50000.0000 [IU] | ORAL_CAPSULE | ORAL | Status: DC

## 2019-01-16 MED ORDER — ACETAMINOPHEN 500 MG PO TABS: 1000.0000 mg | ORAL_TABLET | Freq: Three times a day (TID) | ORAL | Status: DC | PRN

## 2019-01-16 MED ORDER — ENOXAPARIN SODIUM 40 MG/0.4ML ~~LOC~~ SOLN
40.0000 mg | SUBCUTANEOUS | Status: DC
  Administered 2019-01-16 – 2019-01-19 (×3): 40 mg via SUBCUTANEOUS
  Filled 2019-01-16 (×4): qty 0.4

## 2019-01-16 MED ORDER — ACETAMINOPHEN 650 MG RE SUPP: 650.0000 mg | Freq: Four times a day (QID) | RECTAL | Status: DC | PRN

## 2019-01-16 MED ORDER — ALBUTEROL SULFATE (2.5 MG/3ML) 0.083% IN NEBU: 2.5000 mg | INHALATION_SOLUTION | RESPIRATORY_TRACT | Status: DC | PRN

## 2019-01-16 MED ORDER — ONDANSETRON HCL 4 MG/2ML IJ SOLN: 4.0000 mg | Freq: Four times a day (QID) | INTRAMUSCULAR | Status: DC | PRN

## 2019-01-16 MED ORDER — HYDROCODONE-ACETAMINOPHEN 5-325 MG PO TABS: 1.0000 | ORAL_TABLET | ORAL | Status: DC | PRN

## 2019-01-16 MED ORDER — POTASSIUM CHLORIDE IN NACL 20-0.9 MEQ/L-% IV SOLN
INTRAVENOUS | Status: DC
  Administered 2019-01-16: 23:00:00 via INTRAVENOUS
  Filled 2019-01-16 (×4): qty 1000

## 2019-01-16 MED ORDER — ACETAMINOPHEN 325 MG PO TABS
650.0000 mg | ORAL_TABLET | Freq: Four times a day (QID) | ORAL | Status: DC | PRN
  Administered 2019-01-18 – 2019-01-19 (×2): 650 mg via ORAL
  Filled 2019-01-16 (×2): qty 2

## 2019-01-16 MED ORDER — POTASSIUM CHLORIDE 20 MEQ PO PACK
40.0000 meq | PACK | Freq: Once | ORAL | Status: AC
  Administered 2019-01-16: 40 meq via ORAL
  Filled 2019-01-16: qty 2

## 2019-01-16 MED ORDER — LIOTHYRONINE SODIUM 25 MCG PO TABS
25.0000 ug | ORAL_TABLET | Freq: Every day | ORAL | Status: DC
  Administered 2019-01-17 – 2019-01-20 (×4): 25 ug via ORAL
  Filled 2019-01-16 (×5): qty 1

## 2019-01-16 MED ORDER — SODIUM CHLORIDE 0.9 % IV BOLUS
500.0000 mL | Freq: Once | INTRAVENOUS | Status: AC
  Administered 2019-01-16: 500 mL via INTRAVENOUS

## 2019-01-16 MED ORDER — TROLAMINE SALICYLATE 10 % EX CREA
1.0000 "application " | TOPICAL_CREAM | Freq: Four times a day (QID) | CUTANEOUS | Status: DC | PRN
  Filled 2019-01-16: qty 85

## 2019-01-16 MED ORDER — LEVOTHYROXINE SODIUM 150 MCG PO TABS
150.0000 ug | ORAL_TABLET | Freq: Every day | ORAL | Status: DC
  Administered 2019-01-17 – 2019-01-20 (×4): 150 ug via ORAL
  Filled 2019-01-16: qty 3
  Filled 2019-01-16: qty 1
  Filled 2019-01-16 (×3): qty 3
  Filled 2019-01-16 (×3): qty 1

## 2019-01-16 NOTE — H&P (Signed)
[employer] - [place] at [facility]   PATIENT NAME: Joan Cisneros    MR#:  [id number]  DATE OF BIRTH:  January 30, 1931  DATE OF ADMISSION:  01/16/2019  PRIMARY CARE PHYSICIAN: Unice Bailey, MD   REQUESTING/REFERRING PHYSICIAN:   CHIEF COMPLAINT:   Chief Complaint  Patient presents with  . Fall  . Altered Mental Status    HISTORY OF PRESENT ILLNESS: Joan Cisneros  is a 85 y.o. female with a known history per below was found down in her home with altered mental status, patient is under the [facility] pace program in the community, patient is poor historian due to dementia, most of the information is coming from pace employee, patient has been increasingly more more difficult to work with, refusing care in the outpatient setting in her home, in the emergency room patient was found to have potassium of 3.1, glucose 144, CT head negative, calcium 11.1, urinalysis consistent with UTI, patient evaluated in the emergency room with [facility] employee present, patient does complain of frequent urination as well as burning with urination but remains confused and disoriented, patient is now being admitted for acute toxic metabolic encephalopathy secondary to urinary tract infection.  PAST MEDICAL HISTORY:   Dementia Congestive heart failure Hypothyroidism  PAST SURGICAL HISTORY:  Unknown  SOCIAL HISTORY:  Social History   Tobacco Use  . Smoking status: Never Smoker  . Smokeless tobacco: Never Used  Substance Use Topics  . Alcohol use: Not on file    FAMILY HISTORY:  Hypertension  DRUG ALLERGIES: No Known Allergies  REVIEW OF SYSTEMS: Unable to be obtained given encephalopathy  CONSTITUTIONAL: No fever, fatigue or weakness.  EYES: No blurred or double vision.  EARS, NOSE, AND THROAT: No tinnitus or ear pain.  RESPIRATORY: No cough, shortness of breath, wheezing or hemoptysis.  CARDIOVASCULAR: No chest pain, orthopnea, edema.  GASTROINTESTINAL: No nausea, vomiting,  diarrhea or abdominal pain.  GENITOURINARY: No dysuria, hematuria.  ENDOCRINE: No polyuria, nocturia,  HEMATOLOGY: No anemia, easy bruising or bleeding SKIN: No rash or lesion. MUSCULOSKELETAL: No joint pain or arthritis.   NEUROLOGIC: No tingling, numbness, weakness.  PSYCHIATRY: No anxiety or depression.   MEDICATIONS AT HOME:  Prior to Admission medications   Medication Sig Start Date End Date Taking? Authorizing Provider  acetaminophen (TYLENOL) 500 MG tablet Take 1,000 mg by mouth 3 (three) times daily as needed for mild pain or fever.   Yes [provider]  levothyroxine (SYNTHROID, LEVOTHROID) 150 MCG tablet Take 150 mcg by mouth daily.   Yes [provider]  liothyronine (CYTOMEL) 25 MCG tablet Take 25 mcg by mouth daily.   Yes [provider]  losartan (COZAAR) 100 MG tablet Take 100 mg by mouth daily.   Yes [provider]  trolamine salicylate (ASPERCREME) 10 % cream Apply 1 application topically 4 (four) times daily as needed (knee pain).   Yes [provider]  Vitamin D, Ergocalciferol, (DRISDOL) 1.25 MG (50000 UT) CAPS capsule Take 50,000 Units by mouth every 30 (thirty) days.   Yes [provider]      PHYSICAL EXAMINATION:   VITAL SIGNS: Blood pressure (!) 162/85, pulse 92, temperature 97.8 F (36.6 C), resp. rate (!) 24, height 5\' 4"  (1.626 m), weight 54.4 kg, SpO2 99 %.  GENERAL:  85 y.o.-year-old patient lying in the bed with no acute distress.  Frail-appearing EYES: Pupils equal, round, reactive to light and accommodation. No scleral icterus. Extraocular muscles intact.  HEENT: Head atraumatic, normocephalic. Oropharynx  and nasopharynx clear.  Mucous membranes dry  nECK:  Supple, no jugular venous distention. No thyroid enlargement, no tenderness.  Poor skin turgor LUNGS: Normal breath sounds bilaterally, no wheezing, rales,rhonchi or crepitation. No use of accessory muscles of respiration.  CARDIOVASCULAR: S1,  S2 normal. No murmurs, rubs, or gallops.  ABDOMEN: Soft, nontender, nondistended. Bowel sounds present. No organomegaly or mass.  EXTREMITIES: No pedal edema, cyanosis, or clubbing.  Diffuse muscular atrophy NEUROLOGIC: Cranial nerves II through XII are intact. Muscle strength 5/5 in all extremities. Sensation intact. Gait not checked.  PSYCHIATRIC: The patient is awake, alert, confused and disoriented.  SKIN: No obvious rash, lesion, or ulcer.   LABORATORY PANEL:   CBC Recent Labs  Lab 01/16/19 1517  WBC 10.3  HGB 14.5  HCT 44.4  PLT 301  MCV 89.3  MCH 29.2  MCHC 32.7  RDW 13.6  LYMPHSABS 0.7  MONOABS 0.5  EOSABS 0.0  BASOSABS 0.0   ------------------------------------------------------------------------------------------------------------------  Chemistries  Recent Labs  Lab 01/16/19 1517  NA 138  K 3.1*  CL 100  CO2 27  GLUCOSE 144*  BUN 18  CREATININE 1.00  CALCIUM 11.1*  AST 38  ALT 18  ALKPHOS 78  BILITOT 1.0   ------------------------------------------------------------------------------------------------------------------ estimated creatinine clearance is 35.3 mL/min (by C-G formula based on SCr of 1 mg/dL). ------------------------------------------------------------------------------------------------------------------ No results for input(s): TSH, T4TOTAL, T3FREE, THYROIDAB in the last 72 hours.  Invalid input(s): FREET3   Coagulation profile No results for input(s): INR, PROTIME in the last 168 hours. ------------------------------------------------------------------------------------------------------------------- No results for input(s): DDIMER in the last 72 hours. -------------------------------------------------------------------------------------------------------------------  Cardiac Enzymes No results for input(s): CKMB, TROPONINI, MYOGLOBIN in the last 168 hours.  Invalid input(s):  CK ------------------------------------------------------------------------------------------------------------------ Invalid input(s): POCBNP  ---------------------------------------------------------------------------------------------------------------  Urinalysis    Component Value Date/Time   COLORURINE YELLOW (A) 01/16/2019 1623   APPEARANCEUR HAZY (A) 01/16/2019 1623   APPEARANCEUR Clear 08/15/2013 1353   LABSPEC 1.013 01/16/2019 1623   LABSPEC 1.009 08/15/2013 1353   PHURINE 6.0 01/16/2019 1623   GLUCOSEU 150 (A) 01/16/2019 1623   GLUCOSEU 150 mg/dL 95/07/3266 1245   HGBUR LARGE (A) 01/16/2019 1623   BILIRUBINUR NEGATIVE 01/16/2019 1623   BILIRUBINUR Negative 08/15/2013 1353   KETONESUR 5 (A) 01/16/2019 1623   PROTEINUR 30 (A) 01/16/2019 1623   NITRITE NEGATIVE 01/16/2019 1623   LEUKOCYTESUR LARGE (A) 01/16/2019 1623   LEUKOCYTESUR Negative 08/15/2013 1353     RADIOLOGY: Dg Chest 2 View  Result Date: 01/16/2019 CLINICAL DATA:  Cough and altered mental status EXAM: CHEST - 2 VIEW COMPARISON:  May 18, 2014. FINDINGS: A safety pin is noted anteriorly and laterally. There is no appreciable edema or consolidation. There is scarring in the right mid lung and right base regions. The heart size and pulmonary vascularity are normal. No adenopathy. There is aortic atherosclerosis. There is also calcification in the left carotid artery. There is an apparent coronary stent on the left. Bones are osteoporotic. IMPRESSION: Scarring right mid and lower lung zones. No edema or consolidation. Heart size within normal limits. Aortic Atherosclerosis (ICD10-I70.0). There is also left carotid artery calcification. Bones osteoporotic. Electronically Signed   By: Bretta Bang III M.D.   On: 01/16/2019 15:36   Ct Head Wo Contrast  Result Date: 01/16/2019 CLINICAL DATA:  85 year old female status post fall at home yesterday. Found down by the front door again today. EXAM: CT HEAD WITHOUT  CONTRAST TECHNIQUE: Contiguous axial images were obtained from the base of the skull through the vertex without  intravenous contrast. COMPARISON:  Partial images from brain MRI 08/16/2013. Head CT 08/15/2013. FINDINGS: Brain: Cerebral volume has not significantly changed since 2014 and is normal for age. Chronic white matter hypodensity remains mild for age. Chronic hypodensity at the posterior left lentiform is stable. No midline shift, ventriculomegaly, mass effect, evidence of mass lesion, intracranial hemorrhage or evidence of cortically based acute infarction. Vascular: Calcified atherosclerosis at the skull base. No suspicious intracranial vascular hyperdensity. Skull: Negative. Sinuses/Orbits: Visualized paranasal sinuses and mastoids are stable and well pneumatized. Other: Negative orbits. No scalp hematoma identified. IMPRESSION: 1. No acute intracranial abnormality or acute traumatic injury identified. 2. Largely stable non contrast CT appearance of the brain since 2014. Suspect mild for age small vessel disease. Electronically Signed   By: Odessa Fleming M.D.   On: 01/16/2019 15:27   Dg Hip Unilat W Or Wo Pelvis 2-3 Views Left  Result Date: 01/16/2019 CLINICAL DATA:  Fall, left hip pain EXAM: DG HIP (WITH OR WITHOUT PELVIS) 2-3V LEFT COMPARISON:  None. FINDINGS: They are is ossified soft tissue structure noted medial to the left femoral neck. This was not present on remote study from 2014. This presumably represents heterotopic bone or myositis ossificans related to prior soft tissue injury. No visible acute fracture. Early degenerative changes in the hips with early spurring bilaterally. SI joints symmetric and unremarkable. IMPRESSION: No acute bony abnormality. Calcifications/ossification within the soft tissues medial to the left femoral neck, likely related to old soft tissue injury. Electronically Signed   By: Charlett Nose M.D.   On: 01/16/2019 17:19    EKG: Orders placed or performed during the  hospital encounter of 01/16/19  . ED EKG  . ED EKG    IMPRESSION AND PLAN: *Acute toxic metabolic encephalopathy Most likely exacerbated by UTI, compounded by dementia Admit to regular nursing for bed, neurochecks per routine, increase nursing care PRN, aspiration/fall/skin care precautions while in house, check ammonia level, RPR, PT/OT to evaluate/treat, IV fluids for rehydration, and continue close medical monitoring  *Acute UTI Empiric Rocephin and follow-up on cultures  *Acute hypokalemia Replete with p.o. potassium, IV fluids, check magnesium level, BMP in the morning  *Acute dehydration Most likely secondary to poor p.o. intake IV fluids for rehydration  *Chronic hypothyroidism, unspecified Continue Synthroid right  *Chronic hypertension Stable Continue losartan  All the records are reviewed and case discussed with ED provider. Management plans discussed with the patient, family and they are in agreement.  CODE STATUS:full   TOTAL TIME TAKING CARE OF THIS PATIENT: 40 minutes.    Evelena Asa Laraya Pestka M.D on 01/16/2019   Between 7am to 6pm - Pager - [phone]  After 6pm go to [email or web address] - password [email or web address]  [facility]  Office  [phone]  CC: Primary care physician; Unice Bailey, MD   Note: This dictation was prepared with Dragon dictation along with smaller phrase technology. Any transcriptional errors that result from this process are unintentional.

## 2019-01-16 NOTE — ED Provider Notes (Signed)
[facility] Emergency Department Provider Note    First MD Initiated Contact with Patient 01/16/19 1436     (approximate)  I have reviewed the triage vital signs and the nursing notes.   HISTORY  Chief Complaint Fall and Altered Mental Status  Level V Caveat:  dementia  HPI MILIANI LOCHHEAD is a 85 y.o. female with a history of dementia coming from home with home health last seen normal yesterday evening was found by the patient's granddaughter this morning laying on the floor in front of the door.  Has had decline in mental status over the past several months.  Granddaughter states that she is prone to UTI.  Denies any fevers.  Not on any blood thinners.  Patient denies any complaints and initially was refusing any work-up saying that she just "wants to be left alone.  "    History reviewed. No pertinent past medical history. History reviewed. No pertinent family history. History reviewed. No pertinent surgical history. Patient Active Problem List   Diagnosis Date Noted  . Encephalopathy acute 01/16/2019      Prior to Admission medications   Medication Sig Start Date End Date Taking? Authorizing Provider  acetaminophen (TYLENOL) 500 MG tablet Take 1,000 mg by mouth 3 (three) times daily as needed for mild pain or fever.   Yes [provider]  levothyroxine (SYNTHROID, LEVOTHROID) 150 MCG tablet Take 150 mcg by mouth daily.   Yes [provider]  liothyronine (CYTOMEL) 25 MCG tablet Take 25 mcg by mouth daily.   Yes [provider]  losartan (COZAAR) 100 MG tablet Take 100 mg by mouth daily.   Yes [provider]  trolamine salicylate (ASPERCREME) 10 % cream Apply 1 application topically 4 (four) times daily as needed (knee pain).   Yes [provider]  Vitamin D, Ergocalciferol, (DRISDOL) 1.25 MG (50000 UT) CAPS capsule Take 50,000 Units by mouth every 30 (thirty) days.   Yes [provider]     Allergies Patient has no known allergies.    Social History Social History   Tobacco Use  . Smoking status: Never Smoker  . Smokeless tobacco: Never Used  Substance Use Topics  . Alcohol use: Not on file  . Drug use: Not on file    Review of Systems Patient denies headaches, rhinorrhea, blurry vision, numbness, shortness of breath, chest pain, edema, cough, abdominal pain, nausea, vomiting, diarrhea, dysuria, fevers, rashes or hallucinations unless otherwise stated above in HPI. ____________________________________________   PHYSICAL EXAM:  VITAL SIGNS: Vitals:   01/16/19 1434 01/16/19 1647  BP: (!) 159/88 (!) 162/85  Pulse: 92   Resp: 16 (!) 24  Temp: 97.8 F (36.6 C)   SpO2: 99%     Constitutional: Alert pleasant, disoriented to place and time Eyes: Conjunctivae are normal.  Head: Atraumatic. Nose: No congestion/rhinnorhea. Mouth/Throat: Mucous membranes are moist.   Neck: No stridor. Painless ROM.  Cardiovascular: Normal rate, regular rhythm. Grossly normal heart sounds.  Good peripheral circulation. Respiratory: Normal respiratory effort.  No retractions. Lungs CTAB. Gastrointestinal: Soft and nontender. No distention. No abdominal bruits. No CVA tenderness. Genitourinary:  Musculoskeletal: No lower extremity tenderness nor edema.  No joint effusions. Neurologic:  Normal speech and language. No gross focal neurologic deficits are appreciated. No facial droop Skin:  Skin is warm, dry and intact. No rash noted. Psychiatric: Mood and affect are normal. ____________________________________________   LABS (all labs ordered are listed, but only abnormal results are displayed)  Results for  orders placed or performed during the hospital encounter of 01/16/19 (from the past 24 hour(s))  CBC with Differential/Platelet     Status: Abnormal   Collection Time: 01/16/19  3:17 PM  Result Value Ref Range   WBC 10.3 4.0 - 10.5 K/uL   RBC 4.97 3.87 - 5.11 MIL/uL    Hemoglobin 14.5 12.0 - 15.0 g/dL   HCT 96.0 45.4 - 09.8 %   MCV 89.3 80.0 - 100.0 fL   MCH 29.2 26.0 - 34.0 pg   MCHC 32.7 30.0 - 36.0 g/dL   RDW 11.9 14.7 - 82.9 %   Platelets 301 150 - 400 K/uL   nRBC 0.0 0.0 - 0.2 %   Neutrophils Relative % 88 %   Neutro Abs 9.1 (H) 1.7 - 7.7 K/uL   Lymphocytes Relative 7 %   Lymphs Abs 0.7 0.7 - 4.0 K/uL   Monocytes Relative 5 %   Monocytes Absolute 0.5 0.1 - 1.0 K/uL   Eosinophils Relative 0 %   Eosinophils Absolute 0.0 0.0 - 0.5 K/uL   Basophils Relative 0 %   Basophils Absolute 0.0 0.0 - 0.1 K/uL   Immature Granulocytes 0 %   Abs Immature Granulocytes 0.03 0.00 - 0.07 K/uL  Comprehensive metabolic panel     Status: Abnormal   Collection Time: 01/16/19  3:17 PM  Result Value Ref Range   Sodium 138 135 - 145 mmol/L   Potassium 3.1 (L) 3.5 - 5.1 mmol/L   Chloride 100 98 - 111 mmol/L   CO2 27 22 - 32 mmol/L   Glucose, Bld 144 (H) 70 - 99 mg/dL   BUN 18 8 - 23 mg/dL   Creatinine, Ser 5.62 0.44 - 1.00 mg/dL   Calcium 13.0 (H) 8.9 - 10.3 mg/dL   Total Protein 7.1 6.5 - 8.1 g/dL   Albumin 3.8 3.5 - 5.0 g/dL   AST 38 15 - 41 U/L   ALT 18 0 - 44 U/L   Alkaline Phosphatase 78 38 - 126 U/L   Total Bilirubin 1.0 0.3 - 1.2 mg/dL   GFR calc non Af Amer 51 (L) >60 mL/min   GFR calc Af Amer 59 (L) >60 mL/min   Anion gap 11 5 - 15  CK     Status: Abnormal   Collection Time: 01/16/19  3:17 PM  Result Value Ref Range   Total CK 342 (H) 38 - 234 U/L  Urinalysis, Complete w Microscopic     Status: Abnormal   Collection Time: 01/16/19  4:23 PM  Result Value Ref Range   Color, Urine YELLOW (A) YELLOW   APPearance HAZY (A) CLEAR   Specific Gravity, Urine 1.013 1.005 - 1.030   pH 6.0 5.0 - 8.0   Glucose, UA 150 (A) NEGATIVE mg/dL   Hgb urine dipstick LARGE (A) NEGATIVE   Bilirubin Urine NEGATIVE NEGATIVE   Ketones, ur 5 (A) NEGATIVE mg/dL   Protein, ur 30 (A) NEGATIVE mg/dL   Nitrite NEGATIVE NEGATIVE   Leukocytes,Ua LARGE (A) NEGATIVE   RBC /  HPF 21-50 0 - 5 RBC/hpf   WBC, UA >50 (H) 0 - 5 WBC/hpf   Bacteria, UA RARE (A) NONE SEEN   Squamous Epithelial / LPF 0-5 0 - 5   Mucus PRESENT    Hyaline Casts, UA PRESENT    ____________________________________________  EKG My review and personal interpretation at Time: 16:46   Indication: fall  Rate: 85  Rhythm: sinus Axis: normal Other: poor r wave progresion, no stemi  ____________________________________________  RADIOLOGY  I personally reviewed all radiographic images ordered to evaluate for the above acute complaints and reviewed radiology reports and findings.  These findings were personally discussed with the patient.  Please see medical record for radiology report.  ____________________________________________   PROCEDURES  Procedure(s) performed:  Procedures    Critical Care performed: no ____________________________________________   INITIAL IMPRESSION / ASSESSMENT AND PLAN / ED COURSE  Pertinent labs & imaging results that were available during my care of the patient were reviewed by me and considered in my medical decision making (see chart for details).   DDX: Increasing fracture, contusion, UTI, electrolyte abnormality, dementia  KIANN COLVERT is a 85 y.o. who presents to the ED with symptoms as described above.  Patient becoming more encephalopathic over several weeks.  CT imaging radiographs to be ordered for the by differential.  Will check blood work as well as urinalysis.  Clinical Course as of Jan 15 1746  Sat Jan 16, 2019  1547 CT without any evidence of a subdural or subarachnoid.  Chest x-ray without any evidence of pneumonia or consolidation or edema.   [PR]  1724 Urinalysis does show evidence of cystitis.  Will discuss with hospitalist for admission.  We will give IV Rocephin.  Will consult PT as she will need placement.   [PR]    Clinical Course User Index [PR] Willy Eddy, MD     As part of my medical decision making, I  reviewed the following data within the electronic MEDICAL RECORD NUMBER Nursing notes reviewed and incorporated, Labs reviewed, notes from prior ED visits and [place] Controlled Substance Database   ____________________________________________   FINAL CLINICAL IMPRESSION(S) / ED DIAGNOSES  Final diagnoses:  Altered mental status, unspecified altered mental status type      NEW MEDICATIONS STARTED DURING THIS VISIT:  New Prescriptions   No medications on file     Note:  This document was prepared using Dragon voice recognition software and may include unintentional dictation errors.    Willy Eddy, MD 01/16/19 2020

## 2019-01-16 NOTE — Progress Notes (Signed)
Family Meeting Note  Advance Directive:yes  Today a meeting took place with the Patient and [facility] program worker.  Patient is unable to participate due NA:TFTDDU capacity Dementia   The following clinical team members were present during this meeting:MD  The following were discussed:Patient's diagnosis: Encephalopathy, UTI, Patient's progosis: Unable to determine and Goals for treatment: Full Code  Additional follow-up to be provided: prn  Time spent during discussion:20 minutes  Bertrum Sol, MD

## 2019-01-16 NOTE — ED Triage Notes (Signed)
Per family, the pt fell at home yesterday and refused treatment and denied injury. Today when the family could not reach her, they said she was on the floor by the front door. Pt denies and states she was on the couch, but she also has a hx of dementia and is a&ox2. Family states she is altered mentation as well.

## 2019-01-16 NOTE — ED Notes (Signed)
Pt is refusing blood work, etc stating "there's nothing wrong with me! Leave me alone". Awaiting for family.
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
## 2019-01-17 ENCOUNTER — Other Ambulatory Visit: Payer: Self-pay

## 2019-01-17 DIAGNOSIS — L8990 Pressure ulcer of unspecified site, unspecified stage: Secondary | ICD-10-CM

## 2019-01-17 LAB — BASIC METABOLIC PANEL
Anion gap: 6 (ref 5–15)
BUN: 19 mg/dL (ref 8–23)
CO2: 25 mmol/L (ref 22–32)
CREATININE: 0.83 mg/dL (ref 0.44–1.00)
Calcium: 10.2 mg/dL (ref 8.9–10.3)
Chloride: 109 mmol/L (ref 98–111)
GFR calc Af Amer: >60 mL/min (ref >60)
GFR calc non Af Amer: >60 mL/min (ref >60)
Glucose, Bld: 143 mg/dL — ABNORMAL HIGH (ref 70–99)
Potassium: 3.9 mmol/L (ref 3.5–5.1)
Sodium: 140 mmol/L (ref 135–145)

## 2019-01-17 LAB — MAGNESIUM: Magnesium: 2 mg/dL (ref 1.7–2.4)

## 2019-01-17 MED ORDER — ENSURE ENLIVE PO LIQD
237.0000 mL | Freq: Two times a day (BID) | ORAL | Status: DC
  Administered 2019-01-17 – 2019-01-19 (×5): 237 mL via ORAL

## 2019-01-17 MED ORDER — ADULT MULTIVITAMIN W/MINERALS CH
1.0000 | ORAL_TABLET | Freq: Every day | ORAL | Status: DC
  Administered 2019-01-18 – 2019-01-20 (×3): 1 via ORAL
  Filled 2019-01-17 (×3): qty 1

## 2019-01-17 NOTE — [place] FL2 (Signed)
[place] MEDICAID FL2 LEVEL OF CARE SCREENING TOOL     IDENTIFICATION  Patient Name: Joan Cisneros Birthdate: 23-Nov-1930 Sex: female Admission Date (Current Location): 01/16/2019  [place] and [place] Number:  Chiropodist and Address:  [facility], [address], [place], [place] [address]      Provider Number: [id number]  Attending Physician Name and Address:  Enedina Finner, MD  Relative Name and Phone Number:  Blima Rich [phone]; Fartun Camerer [phone]    Current Level of Care:   Recommended Level of Care:   Prior Approval Number:    Date Approved/Denied:   PASRR Number: [id number]  Discharge Plan: SNF    Current Diagnoses: Patient Active Problem List   Diagnosis Date Noted  . Pressure injury of skin 01/17/2019  . Encephalopathy acute 01/16/2019    Orientation RESPIRATION BLADDER Height & Weight     Self, Situation  Normal Continent Weight: 120 lb (54.4 kg) Height:  5\' 4"  (162.6 cm)  BEHAVIORAL SYMPTOMS/MOOD NEUROLOGICAL BOWEL NUTRITION STATUS      Continent Diet(Heart Healthy Diet; Fluid consistency thin)  AMBULATORY STATUS COMMUNICATION OF NEEDS Skin   Supervision Verbally Normal                       Personal Care Assistance Level of Assistance  Bathing, Feeding, Dressing, Total care Bathing Assistance: Limited assistance Feeding assistance: Limited assistance Dressing Assistance: Limited assistance Total Care Assistance: Limited assistance   Functional Limitations Info  Sight, Hearing, Speech Sight Info: Adequate Hearing Info: Adequate Speech Info: Adequate    SPECIAL CARE FACTORS FREQUENCY  PT (By licensed PT), OT (By licensed OT)     PT Frequency: 5x OT Frequency: 3x            Contractures Contractures Info: Not present    Additional Factors Info  Allergies, Code Status Code Status Info: Full Code Allergies Info: No known Allergies           Current Medications  (01/17/2019):  This is the current hospital active medication list Current Facility-Administered Medications  Medication Dose Route Frequency Provider Last Rate Last Dose  . acetaminophen (TYLENOL) tablet 650 mg  650 mg Oral Q6H PRN Salary, Montell D, MD       Or  . acetaminophen (TYLENOL) suppository 650 mg  650 mg Rectal Q6H PRN Salary, Montell D, MD      . acetaminophen (TYLENOL) tablet 1,000 mg  1,000 mg Oral TID PRN Salary, Montell D, MD      . albuterol (PROVENTIL) (2.5 MG/3ML) 0.083% nebulizer solution 2.5 mg  2.5 mg Nebulization Q2H PRN Salary, Montell D, MD      . cefTRIAXone (ROCEPHIN) 1 g in sodium chloride 0.9 % 100 mL IVPB  1 g Intravenous Q24H Salary, Montell D, MD      . enoxaparin (LOVENOX) injection 40 mg  40 mg Subcutaneous Q24H Salary, Montell D, MD   40 mg at 01/16/19 2238  . levothyroxine (SYNTHROID, LEVOTHROID) tablet 150 mcg  150 mcg Oral Daily Salary, Jetty Duhamel D, MD   150 mcg at 01/17/19 0605  . liothyronine (CYTOMEL) tablet 25 mcg  25 mcg Oral Daily Salary, Montell D, MD   25 mcg at 01/17/19 0930  . losartan (COZAAR) tablet 100 mg  100 mg Oral Daily Salary, Montell D, MD   100 mg at 01/17/19 0930  . ondansetron (ZOFRAN) tablet 4 mg  4 mg Oral Q6H PRN Salary, Evelena Asa, MD  Or  . ondansetron (ZOFRAN) injection 4 mg  4 mg Intravenous Q6H PRN Salary, Montell D, MD      . polyethylene glycol (MIRALAX / GLYCOLAX) packet 17 g  17 g Oral Daily PRN Salary, Montell D, MD      . trolamine salicylate (ASPERCREME) 10 % cream 1 application  1 application Topical QID PRN Salary, Montell D, MD      . Melene Muller ON 02/08/2019] Vitamin D (Ergocalciferol) (DRISDOL) capsule 50,000 Units  50,000 Units Oral Q30 days Salary, Evelena Asa, MD         Discharge Medications: Please see discharge summary for a list of discharge medications.  Relevant Imaging Results:  Relevant Lab Results:   Additional Information SSN: [id number]  Larwance Rote, LCSW

## 2019-01-17 NOTE — Progress Notes (Signed)
   01/17/19 2050  Clinical Encounter Type  Visited With Patient  Visit Type Initial  Referral From Nurse  Consult/Referral To Chaplain  Spiritual Encounters  Spiritual Needs Emotional  CH received page from medical staff to visit patient. Patient was speaking loudly upon arrival. Able to make wishes known. Demanded to speak to granddaughter. CH contacted granddaughter and patient seemed much more relaxed after conversation. Spent several minutes building rapport. Pastoral visit was appreciated.
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
## 2019-01-17 NOTE — Clinical Social Work Note (Signed)
CSW met w/pt at pt's bedside. CSW introduce herself to the patient and pt gave permission for CSW assessment.  Pt., oriented x2 to self, and situation.  Patient believes she is in a hospital in [place] where her son lives. Believes that her son came to visit her.  Clinician asked pt regarding contacting her family members.  Discussed w/pt SNF as a possible discharge.  Pt would like for clinician to call her son Hedy Camara.  Stated "I'll do what ever he wants me to do."   Clinician provided pt with Medicare.gov Nursing Home compare preferred list. Copy place in pt's chart.   Plan/next step:  Contact son Sarahy Creedon, [phone].  Contact [facility] [phone]. Complete FL2/PASRR and CSW assessment.    Berenice Bouton, MSW, LCSW  [phone]

## 2019-01-17 NOTE — Clinical Social Work Note (Signed)
CSW received telephone call from Dr. Allena Katz.  Update/consult regarding pt's discharge plan/next step. See CSW note.   Larwance Rote, MSW, LCSW  [phone]

## 2019-01-17 NOTE — Progress Notes (Signed)
Initial Nutrition Assessment  DOCUMENTATION CODES:   Severe malnutrition in context of chronic illness  INTERVENTION:  Recommend liberalizing diet to regular.  Provide Ensure Enlive po BID, each supplement provides 350 kcal and 20 grams of protein.  Provide daily MVI.  NUTRITION DIAGNOSIS:   Severe Malnutrition related to chronic illness(dementia, advanced age, inadequate oral intake) as evidenced by severe fat depletion, severe muscle depletion.  GOAL:   Patient will meet greater than or equal to 90% of their needs  MONITOR:   PO intake, Supplement acceptance, Labs, Weight trends, Skin, I & O's  REASON FOR ASSESSMENT:   Malnutrition Screening Tool, Consult Assessment of nutrition requirement/status  ASSESSMENT:   85 year old female with PMHx of dementia, hypothyroidism admitted with acute toxic metabolic encephalopathy, UTI.   Met with patient and her granddaughter at bedside. Most of history provided by granddaughter as patient has dementia. She reports patient lives alone at home. She is in the [facility] program but lately has been unable to attend during the day (they are fed a meal there). They have been sending someone out to the house for one hour a day, but nutrition-wise they just make sure patient has food available. Patient has difficulty preparing food and cannot even use the microwave. The only things she is eating at home is ice cream sandwiches and waffles she makes in the toaster. She also drinks some milk. She has not been drinking ONS but is amenable to start drinking them to better meet calorie/protein needs. Noted poor dentition. Patient endorses difficulty chewing tougher foods. She feels she will be able to chew the food available on menu. Discussed option of downgrading to a mechanical soft diet if she has difficulty.  Patient's UBW was 160 lbs. Granddaughter reports she has been losing weight slowly over time. RD obtained bed scale weight of 111 lbs today on low  bed so unsure exactly how accurate that weight is. Granddaughter believes that sounds right though as she knew she was under 120 lbs.  Medications reviewed and include: levothyroxine, liothyronine, vitamin D 50000 units every 30 days, ceftriaxone.  Labs reviewed.  NUTRITION - FOCUSED PHYSICAL EXAM:    Most Recent Value  Orbital Region  Severe depletion  Upper Arm Region  Severe depletion  Thoracic and Lumbar Region  Severe depletion  Buccal Region  Severe depletion  Temple Region  Severe depletion  Clavicle Bone Region  Severe depletion  Clavicle and Acromion Bone Region  Severe depletion  Scapular Bone Region  Severe depletion  Dorsal Hand  Severe depletion  Patellar Region  Severe depletion  Anterior Thigh Region  Severe depletion  Posterior Calf Region  Severe depletion  Edema (RD Assessment)  None  Hair  Reviewed  Eyes  Reviewed  Mouth  Reviewed [poor dentition]  Skin  Reviewed  Nails  Reviewed     Diet Order:   Diet Order            Diet Heart Room service appropriate? Yes; Fluid consistency: Thin  Diet effective now             EDUCATION NEEDS:   No education needs have been identified at this time  Skin:  Skin Assessment: Skin Integrity Issues:(stage I medial sacrum)  Last BM:  Unknown/PTA  Height:   Ht Readings from Last 1 Encounters:  01/16/19 5' 4"  (1.626 m)   Weight:   Wt Readings from Last 1 Encounters:  01/17/19 50.3 kg   Ideal Body Weight:  54.5 kg  BMI:  Body mass index is 19.05 kg/m.  Estimated Nutritional Needs:   Kcal:  1200-1400  Protein:  65-75 grams  Fluid:  1.2-1.4 L/day  Willey Blade, MS, RD, LDN Office: [phone] Pager: [phone] After Hours/Weekend Pager: [phone]

## 2019-01-17 NOTE — Progress Notes (Addendum)
[facility] - [place] at [facility]   PATIENT NAME: Joan Cisneros    MR#:  [id number]  DATE OF BIRTH:  1931-08-18  SUBJECTIVE:  patient came in from home after she was found down on the floor. She is awake alert oriented times two. Eating breakfast. No family in the room.  REVIEW OF SYSTEMS:   Review of Systems  Constitutional: Negative for chills, fever and weight loss.  HENT: Negative for ear discharge, ear pain and nosebleeds.   Eyes: Negative for blurred vision, pain and discharge.  Respiratory: Negative for sputum production, shortness of breath, wheezing and stridor.   Cardiovascular: Negative for chest pain, palpitations, orthopnea and PND.  Gastrointestinal: Negative for abdominal pain, diarrhea, nausea and vomiting.  Genitourinary: Negative for frequency and urgency.  Musculoskeletal: Negative for back pain and joint pain.  Neurological: Positive for weakness. Negative for sensory change, speech change and focal weakness.  Psychiatric/Behavioral: Negative for depression and hallucinations. The patient is not nervous/anxious.    Tolerating Diet:yes Tolerating PT: pending  DRUG ALLERGIES:  No Known Allergies  VITALS:  Blood pressure (!) 150/73, pulse 91, temperature 97.8 F (36.6 C), resp. rate 16, height  (1.626 m), weight 54.4 kg, SpO2 94 %.  PHYSICAL EXAMINATION:   Physical Exam  GENERAL:  85 y.o.-year-old patient lying in the bed with no acute distress. Thin and petite! EYES: Pupils equal, round, reactive to light and accommodation. No scleral icterus. Extraocular muscles intact.  HEENT: Head atraumatic, normocephalic. Oropharynx and nasopharynx clear.  NECK:  Supple, no jugular venous distention. No thyroid enlargement, no tenderness.  LUNGS: Normal breath sounds bilaterally, no wheezing, rales, rhonchi. No use of accessory muscles of respiration.  CARDIOVASCULAR: S1, S2 normal. No murmurs, rubs, or gallops.  ABDOMEN: Soft,  nontender, nondistended. Bowel sounds present. No organomegaly or mass.  EXTREMITIES: No cyanosis, clubbing or edema b/l.    NEUROLOGIC: Cranial nerves II through XII are intact. No focal Motor or sensory deficits b/l.   PSYCHIATRIC:  patient is alert and oriented x2  SKIN: No obvious rash, lesion, or ulcer.   LABORATORY PANEL:  CBC Recent Labs  Lab 01/16/19 1517  WBC 10.3  HGB 14.5  HCT 44.4  PLT 301    Chemistries  Recent Labs  Lab 01/16/19 1517 01/17/19 0501  NA 138 140  K 3.1* 3.9  CL 100 109  CO2 27 25  GLUCOSE 144* 143*  BUN 18 19  CREATININE 1.00 0.83  CALCIUM 11.1* 10.2  MG  --  2.0  AST 38  --   ALT 18  --   ALKPHOS 78  --   BILITOT 1.0  --    Cardiac Enzymes No results for input(s): TROPONINI in the last 168 hours. RADIOLOGY:  Dg Chest 2 View  Result Date: 01/16/2019 CLINICAL DATA:  Cough and altered mental status EXAM: CHEST - 2 VIEW COMPARISON:  May 18, 2014. FINDINGS: A safety pin is noted anteriorly and laterally. There is no appreciable edema or consolidation. There is scarring in the right mid lung and right base regions. The heart size and pulmonary vascularity are normal. No adenopathy. There is aortic atherosclerosis. There is also calcification in the left carotid artery. There is an apparent coronary stent on the left. Bones are osteoporotic. IMPRESSION: Scarring right mid and lower lung zones. No edema or consolidation. Heart size within normal limits. Aortic Atherosclerosis (ICD10-I70.0). There is also left carotid artery calcification. Bones osteoporotic. Electronically Signed   By: Bretta Bang III  M.D.   On: 01/16/2019 15:36   Ct Head Wo Contrast  Result Date: 01/16/2019 CLINICAL DATA:  85 year old female status post fall at home yesterday. Found down by the front door again today. EXAM: CT HEAD WITHOUT CONTRAST TECHNIQUE: Contiguous axial images were obtained from the base of the skull through the vertex without intravenous contrast.  COMPARISON:  Partial images from brain MRI 08/16/2013. Head CT 08/15/2013. FINDINGS: Brain: Cerebral volume has not significantly changed since 2014 and is normal for age. Chronic white matter hypodensity remains mild for age. Chronic hypodensity at the posterior left lentiform is stable. No midline shift, ventriculomegaly, mass effect, evidence of mass lesion, intracranial hemorrhage or evidence of cortically based acute infarction. Vascular: Calcified atherosclerosis at the skull base. No suspicious intracranial vascular hyperdensity. Skull: Negative. Sinuses/Orbits: Visualized paranasal sinuses and mastoids are stable and well pneumatized. Other: Negative orbits. No scalp hematoma identified. IMPRESSION: 1. No acute intracranial abnormality or acute traumatic injury identified. 2. Largely stable non contrast CT appearance of the brain since 2014. Suspect mild for age small vessel disease. Electronically Signed   By: Odessa Fleming M.D.   On: 01/16/2019 15:27   Dg Hip Unilat W Or Wo Pelvis 2-3 Views Left  Result Date: 01/16/2019 CLINICAL DATA:  Fall, left hip pain EXAM: DG HIP (WITH OR WITHOUT PELVIS) 2-3V LEFT COMPARISON:  None. FINDINGS: They are is ossified soft tissue structure noted medial to the left femoral neck. This was not present on remote study from 2014. This presumably represents heterotopic bone or myositis ossificans related to prior soft tissue injury. No visible acute fracture. Early degenerative changes in the hips with early spurring bilaterally. SI joints symmetric and unremarkable. IMPRESSION: No acute bony abnormality. Calcifications/ossification within the soft tissues medial to the left femoral neck, likely related to old soft tissue injury. Electronically Signed   By: Charlett Nose M.D.   On: 01/16/2019 17:19   ASSESSMENT AND PLAN:   Joan Cisneros  is a 85 y.o. female with a known history per below was found down in her home with altered mental status, patient is under the [facility] pace  program in the community, patient is poor historian due to dementia. patient is now being admitted for acute toxic metabolic encephalopathy secondary to urinary tract infection  *Acute toxic metabolic encephalopathy -Most likely exacerbated by UTI, compounded by dementia - neurochecks per routine, increase nursing care PRN, aspiration/fall/skin care precautions while in house -IV fluids for rehydration, and continue close medical monitoring -patient alert oriented feeding herself breakfast. No fever.  * UTI Empiric Rocephin and follow-up on cultures  * hypokalemia Repleted with p.o. potassium,  *mild dehydration Most likely secondary to poor p.o. intake IV fluids for rehydration  *Chronic hypothyroidism, unspecified Continue Synthroid right  *Chronic hypertension Stable Continue losartan  physical therapy to see patient. Care management/social worker for discharge planning. I will try to reach son in [place].  CODE STATUS: full  DVT Prophylaxis: lovenox  TOTAL TIME TAKING CARE OF THIS PATIENT: *25* minutes.  >50% time spent on counselling and coordination of care  POSSIBLE D/C IN *1-2* DAYS, DEPENDING ON CLINICAL CONDITION.  Note: This dictation was prepared with Dragon dictation along with smaller phrase technology. Any transcriptional errors that result from this process are unintentional.  Enedina Finner M.D on 01/17/2019 at 11:39 AM  Between 7am to 6pm - Pager - [phone]  After 6pm go to [email or web address] - Social research officer, government  [facility]  Office  [phone]  CC: Primary care  physician; Unice Bailey, MDPatient ID: Frederik Pear, female   DOB: [birth year], 85 y.o.   MRN: [id number]

## 2019-01-17 NOTE — Progress Notes (Signed)
PT Cancellation Note  Patient Details Name: Joan Cisneros MRN: [id number] DOB: [birth year]   Cancelled Treatment:    Reason Eval/Treat Not Completed: Patient's level of consciousness(Chart reviewed, RN consulted. Pt asleep upon entry, diffciulty to arrouse. Pt refuses participation at this time, reports drowiness as reason, she expresses oncerns it will affect her mobility. ) Will attempt again at later date/time as schedule allows, once patient is better able to participate.   2:58 PM, 01/17/19 Rosamaria Lints, PT, DPT Physical Therapist - [facility]  [phone] (ASCOM)     [name] 01/17/2019, 2:58 PM

## 2019-01-18 DIAGNOSIS — E43 Unspecified severe protein-calorie malnutrition: Secondary | ICD-10-CM

## 2019-01-18 MED ORDER — ENSURE ENLIVE PO LIQD: 237.0000 mL | Freq: Two times a day (BID) | ORAL | 12 refills | Status: AC

## 2019-01-18 MED ORDER — ADULT MULTIVITAMIN W/MINERALS CH: 1.0000 | ORAL_TABLET | Freq: Every day | ORAL | 0 refills | Status: AC

## 2019-01-18 MED ORDER — CEPHALEXIN 250 MG PO CAPS
250.0000 mg | ORAL_CAPSULE | Freq: Four times a day (QID) | ORAL | Status: DC
  Administered 2019-01-18 – 2019-01-20 (×8): 250 mg via ORAL
  Filled 2019-01-18 (×11): qty 1

## 2019-01-18 MED ORDER — CEPHALEXIN 250 MG PO CAPS: 250.0000 mg | ORAL_CAPSULE | Freq: Four times a day (QID) | ORAL | 0 refills | Status: AC

## 2019-01-18 NOTE — TOC Progression Note (Signed)
Transition of Care [facility]) - Progression Note    Patient Details  Name: Joan Cisneros MRN: [id number] Date of Birth: Jul 27, 1931  Transition of Care [facility]) CM/SW Contact  Ruthe Mannan, [place] Phone Number: 01/18/2019, 12:13 PM  Clinical Narrative:   CSW spoke with patient's granddaughter Blima Rich [phone]. Shanda Bumps would like to accept bed offer at [facility]. CSW contacted Gavin Pound at [facility] and she states that they can accept patient but she is unable to accept her today. Per Gavin Pound they should be able to take patient tomorrow. CSW notified MD, grand daughter and [facility] social worker Damian Leavell [phone] of above. CSW will continue to follow for discharge planning.     Expected Discharge Plan: Skilled Nursing Facility Barriers to Discharge: Unsafe home situation, SNF Pending bed offer  Expected Discharge Plan and Services Expected Discharge Plan: Skilled Nursing Facility Discharge Planning Services: Other - See comment(SNF [facility] program) Post Acute Care Choice: Skilled Nursing Facility Living arrangements for the past 2 months: Single Family Home Expected Discharge Date: 01/18/19                         Social Determinants of Health (SDOH) Interventions    Readmission Risk Interventions 30 Day Unplanned Readmission Risk Score     ED to Hosp-Admission (Current) from 01/16/2019 in [facility] (1A)  30 Day Unplanned Readmission Risk Score (%)  9 Filed at 01/18/2019 1200     This score is the patient's risk of an unplanned readmission within 30 days of being discharged (0 -100%). The score is based on dignosis, age, lab data, medications, orders, and past utilization.   Low:  0-14.9   Medium: 15-21.9   High: 22-29.9   Extreme: 30 and above       No flowsheet data found.

## 2019-01-18 NOTE — Evaluation (Signed)
Physical Therapy Evaluation Patient Details Name: Joan Cisneros MRN: [id number] DOB: [birth year] Today's Date: 01/18/2019   History of Present Illness  Patient is an 85 year old female admitted from home after fall. Admitted here with encephalopathy due to UTI. Patient is part of [facility] program, has dementia. PMH to include: CHF, hypothyroidism.    Clinical Impression  Patient received in bed, sleeping. Easily aroused, but then easily goes back to sleep. Requires min/mod assist for bed mobility, but possibly just due to lethargy. Patient requires min assist to stand and take a few steps from bed to recliner with B hand held assist. Patient will benefit from continued skilled PT to address her functional limitations, weakness, decreased activity tolerance and decreased safety with mobility.       Follow Up Recommendations SNF    Equipment Recommendations  (can be determined at next venue)    Recommendations for Other Services       Precautions / Restrictions Precautions Precautions: Fall Restrictions Weight Bearing Restrictions: No      Mobility  Bed Mobility Overal bed mobility: Needs Assistance Bed Mobility: Supine to Sit     Supine to sit: Min assist     General bed mobility comments: required assistance with LEs to bring off bed and assist to raise trunk to seated position.   Transfers Overall transfer level: Needs assistance Equipment used: 1 person hand held assist Transfers: Sit to/from Stand Sit to Stand: Min assist            Ambulation/Gait Ambulation/Gait assistance: Min assist Gait Distance (Feet): 2 Feet Assistive device: 1 person hand held assist Gait Pattern/deviations: Step-to pattern;Decreased step length - right;Decreased step length - left Gait velocity: decreased   General Gait Details: patient able to take a few steps from bed to recliner with B hand held assist.   Stairs            Wheelchair Mobility    Modified Rankin (Stroke  Patients Only)       Balance Overall balance assessment: Needs assistance Sitting-balance support: Single extremity supported;Feet supported Sitting balance-Leahy Scale: Good     Standing balance support: Bilateral upper extremity supported Standing balance-Leahy Scale: Fair                               Pertinent Vitals/Pain Pain Assessment: Faces Faces Pain Scale: Hurts a little bit Pain Location: none specified Pain Intervention(s): Monitored during session    Home Living Family/patient expects to be discharged to:: Private residence Living Arrangements: Alone Available Help at Discharge: Family;Available PRN/intermittently           Home Equipment: None Additional Comments: Unusre of details of home environment, per chart patient lives alone, was driving, granddaughter is only family in town who sees her occasionally. Patient goes to [facility].      Prior Function Level of Independence: Independent         Comments: patient reports she did not use AD, unsure if accurate     Hand Dominance        Extremity/Trunk Assessment   Upper Extremity Assessment Upper Extremity Assessment: Generalized weakness    Lower Extremity Assessment Lower Extremity Assessment: Generalized weakness       Communication   Communication: No difficulties  Cognition Arousal/Alertness: Lethargic Behavior During Therapy: WFL for tasks assessed/performed Overall Cognitive Status: No family/caregiver present to determine baseline cognitive functioning  General Comments      Exercises     Assessment/Plan    PT Assessment Patient needs continued PT services  PT Problem List Decreased strength;Decreased balance;Decreased cognition;Decreased knowledge of precautions;Pain;Decreased mobility;Decreased activity tolerance;Decreased safety awareness       PT Treatment Interventions DME instruction;Functional  mobility training;Balance training;Patient/family education;Gait training;Therapeutic activities;Neuromuscular re-education;Stair training;Therapeutic exercise;Cognitive remediation    PT Goals (Current goals can be found in the Care Plan section)  Acute Rehab PT Goals Patient Stated Goal: none stated Time For Goal Achievement: 01/25/19    Frequency Min 2X/week   Barriers to discharge Decreased caregiver support      Co-evaluation               AM-PAC PT "6 Clicks" Mobility  Outcome Measure Help needed turning from your back to your side while in a flat bed without using bedrails?: A Little Help needed moving from lying on your back to sitting on the side of a flat bed without using bedrails?: A Lot Help needed moving to and from a bed to a chair (including a wheelchair)?: A Little Help needed standing up from a chair using your arms (e.g., wheelchair or bedside chair)?: A Little Help needed to walk in hospital room?: A Little Help needed climbing 3-5 steps with a railing? : A Lot 6 Click Score: 16    End of Session Equipment Utilized During Treatment: Gait belt Activity Tolerance: Patient limited by lethargy Patient left: with call bell/phone within reach;with chair alarm set;in chair Nurse Communication: Mobility status PT Visit Diagnosis: Unsteadiness on feet (R26.81);Muscle weakness (generalized) (M62.81);Difficulty in walking, not elsewhere classified (R26.2);Pain;History of falling (Z91.81) Pain - part of body: (no specific location  )    Time: 7106-2694 PT Time Calculation (min) (ACUTE ONLY): 18 min   Charges:   PT Evaluation $PT Eval Low Complexity: 1 Low          Dona Klemann, PT, GCS 01/18/19,10:11 AM

## 2019-01-18 NOTE — Discharge Summary (Signed)
[facility] Physicians - [place] at [facility]   PATIENT NAME: Joan Cisneros    MR#:  [id number]  DATE OF BIRTH:  1930-11-23  DATE OF ADMISSION:  01/16/2019 ADMITTING PHYSICIAN: Bertrum Sol, MD  DATE OF DISCHARGE: 01/18/2019  PRIMARY CARE PHYSICIAN: Unice Bailey, MD    ADMISSION DIAGNOSIS:  Altered mental status, unspecified altered mental status type [R41.82]  DISCHARGE DIAGNOSIS:  acute encephalopathy suspected due to UTI progressive worsening dementia  SECONDARY DIAGNOSIS:  History reviewed. No pertinent past medical history.  HOSPITAL COURSE:  [name] a85 y.o.femalewith a known history per below was found down in her home with altered mental status, patient is under the[facility] program in the community, patient is poor historian due to dementia. patient is now being admitted for acute toxic metabolic encephalopathy secondary to urinary tract infection  *Acute toxic metabolic encephalopathy -Most likely exacerbated by UTI, compounded by dementia - aspiration/fall/skin care precautions while in house -received IV fluids for rehydration -. No fever.  * UTI Empiric Rocephin--UC was not sent from ER -change to po keflex for 5 days  * hypokalemia Repleted with p.o. potassium  *mild dehydration Most likely secondary to poor p.o. intake Received IV fluids for rehydration  *Chronic hypothyroidism, unspecified Continue Synthroid right  *hypertension Stable Continue losartan  *Severe malnutrition in the context of chronic illness. Patient evaluated by Dr. dietitian. Continue dietitian recommendations.  physical therapy to see patient. Care management/social worker for discharge planning.   Patient will discharged to rehab. Social worker consultation noted. I spoke at length with Joan Cisneros patient's son he is in agreement with plan. The healthcare power of attorney. According to him patient is a no code DNR.   Dr.  Victory Cisneros from pace program aware spoke with her this morning.  CONSULTS OBTAINED:    DRUG ALLERGIES:  No Known Allergies  DISCHARGE MEDICATIONS:   Allergies as of 01/18/2019   No Known Allergies     Medication List    TAKE these medications   acetaminophen 500 MG tablet Commonly known as:  TYLENOL Take 1,000 mg by mouth 3 (three) times daily as needed for mild pain or fever.   cephALEXin 250 MG capsule Commonly known as:  KEFLEX Take 1 capsule (250 mg total) by mouth every 6 (six) hours for 5 days.   feeding supplement (ENSURE ENLIVE) Liqd Take 237 mLs by mouth 2 (two) times daily between meals.   levothyroxine 150 MCG tablet Commonly known as:  SYNTHROID, LEVOTHROID Take 150 mcg by mouth daily.   liothyronine 25 MCG tablet Commonly known as:  CYTOMEL Take 25 mcg by mouth daily.   losartan 100 MG tablet Commonly known as:  COZAAR Take 100 mg by mouth daily.   multivitamin with minerals Tabs tablet Take 1 tablet by mouth daily.   trolamine salicylate 10 % cream Commonly known as:  ASPERCREME Apply 1 application topically 4 (four) times daily as needed (knee pain).   Vitamin D (Ergocalciferol) 1.25 MG (50000 UT) Caps capsule Commonly known as:  DRISDOL Take 50,000 Units by mouth every 30 (thirty) days.       If you experience worsening of your admission symptoms, develop shortness of breath, life threatening emergency, suicidal or homicidal thoughts you must seek medical attention immediately by calling 911 or calling your MD immediately  if symptoms less severe.  You Must read complete instructions/literature along with all the possible adverse reactions/side effects for all the Medicines you take and that have been prescribed to you. Take  any new Medicines after you have completely understood and accept all the possible adverse reactions/side effects.   Please note  You were cared for by a hospitalist during your hospital stay. If you have any questions about  your discharge medications or the care you received while you were in the hospital after you are discharged, you can call the unit and asked to speak with the hospitalist on call if the hospitalist that took care of you is not available. Once you are discharged, your primary care physician will handle any further medical issues. Please note that NO REFILLS for any discharge medications will be authorized once you are discharged, as it is imperative that you return to your primary care physician (or establish a relationship with a primary care physician if you do not have one) for your aftercare needs so that they can reassess your need for medications and monitor your lab values. Today   SUBJECTIVE   Awake and alert. Baseline confusion present.  VITAL SIGNS:  Blood pressure (!) 165/71, pulse 81, temperature 97.9 F (36.6 C), temperature source Axillary, resp. rate 18, height 5\' 4"  (1.626 m), weight 54.9 kg, SpO2 97 %.  I/O:    Intake/Output Summary (Last 24 hours) at 01/18/2019 0809 Last data filed at 01/17/2019 2339 Gross per 24 hour  Intake -  Output 300 ml  Net -300 ml    PHYSICAL EXAMINATION:  GENERAL:  85 y.o.-year-old patient lying in the bed with no acute distress. Thin cachectic EYES: Pupils equal, round, reactive to light and accommodation. No scleral icterus. Extraocular muscles intact.  HEENT: Head atraumatic, normocephalic. Oropharynx and nasopharynx clear.  NECK:  Supple, no jugular venous distention. No thyroid enlargement, no tenderness.  LUNGS: Normal breath sounds bilaterally, no wheezing, rales,rhonchi or crepitation. No use of accessory muscles of respiration.  CARDIOVASCULAR: S1, S2 normal. No murmurs, rubs, or gallops.  ABDOMEN: Soft, non-tender, non-distended. Bowel sounds present. No organomegaly or mass.  EXTREMITIES: No pedal edema, cyanosis, or clubbing.  NEUROLOGIC: Cranial nerves II through XII are intact. No focal weakness. Sensation intact. Gait not checked.   PSYCHIATRIC: The patient is alert and awake with some baseline confusion SKIN: No obvious rash, lesion, or ulcer.   DATA REVIEW:   CBC  Recent Labs  Lab 01/16/19 1517  WBC 10.3  HGB 14.5  HCT 44.4  PLT 301    Chemistries  Recent Labs  Lab 01/16/19 1517 01/17/19 0501  NA 138 140  K 3.1* 3.9  CL 100 109  CO2 27 25  GLUCOSE 144* 143*  BUN 18 19  CREATININE 1.00 0.83  CALCIUM 11.1* 10.2  MG  --  2.0  AST 38  --   ALT 18  --   ALKPHOS 78  --   BILITOT 1.0  --     Microbiology Results   No results found for this or any previous visit (from the past 240 hour(s)).  RADIOLOGY:  Dg Chest 2 View  Result Date: 01/16/2019 CLINICAL DATA:  Cough and altered mental status EXAM: CHEST - 2 VIEW COMPARISON:  May 18, 2014. FINDINGS: A safety pin is noted anteriorly and laterally. There is no appreciable edema or consolidation. There is scarring in the right mid lung and right base regions. The heart size and pulmonary vascularity are normal. No adenopathy. There is aortic atherosclerosis. There is also calcification in the left carotid artery. There is an apparent coronary stent on the left. Bones are osteoporotic. IMPRESSION: Scarring right mid and lower lung zones.  No edema or consolidation. Heart size within normal limits. Aortic Atherosclerosis (ICD10-I70.0). There is also left carotid artery calcification. Bones osteoporotic. Electronically Signed   By: Bretta Bang III M.D.   On: 01/16/2019 15:36   Ct Head Wo Contrast  Result Date: 01/16/2019 CLINICAL DATA:  85 year old female status post fall at home yesterday. Found down by the front door again today. EXAM: CT HEAD WITHOUT CONTRAST TECHNIQUE: Contiguous axial images were obtained from the base of the skull through the vertex without intravenous contrast. COMPARISON:  Partial images from brain MRI 08/16/2013. Head CT 08/15/2013. FINDINGS: Brain: Cerebral volume has not significantly changed since 2014 and is normal for age.  Chronic white matter hypodensity remains mild for age. Chronic hypodensity at the posterior left lentiform is stable. No midline shift, ventriculomegaly, mass effect, evidence of mass lesion, intracranial hemorrhage or evidence of cortically based acute infarction. Vascular: Calcified atherosclerosis at the skull base. No suspicious intracranial vascular hyperdensity. Skull: Negative. Sinuses/Orbits: Visualized paranasal sinuses and mastoids are stable and well pneumatized. Other: Negative orbits. No scalp hematoma identified. IMPRESSION: 1. No acute intracranial abnormality or acute traumatic injury identified. 2. Largely stable non contrast CT appearance of the brain since 2014. Suspect mild for age small vessel disease. Electronically Signed   By: Odessa Fleming M.D.   On: 01/16/2019 15:27   Dg Hip Unilat W Or Wo Pelvis 2-3 Views Left  Result Date: 01/16/2019 CLINICAL DATA:  Fall, left hip pain EXAM: DG HIP (WITH OR WITHOUT PELVIS) 2-3V LEFT COMPARISON:  None. FINDINGS: They are is ossified soft tissue structure noted medial to the left femoral neck. This was not present on remote study from 2014. This presumably represents heterotopic bone or myositis ossificans related to prior soft tissue injury. No visible acute fracture. Early degenerative changes in the hips with early spurring bilaterally. SI joints symmetric and unremarkable. IMPRESSION: No acute bony abnormality. Calcifications/ossification within the soft tissues medial to the left femoral neck, likely related to old soft tissue injury. Electronically Signed   By: Charlett Nose M.D.   On: 01/16/2019 17:19     CODE STATUS:     Code Status Orders  (From admission, onward)         Start     Ordered   01/18/19 0809  Do not attempt resuscitation (DNR)  Continuous    Question Answer Comment  In the event of cardiac or respiratory ARREST Do not call a "code blue"   In the event of cardiac or respiratory ARREST Do not perform Intubation, CPR,  defibrillation or ACLS   In the event of cardiac or respiratory ARREST Use medication by any route, position, wound care, and other measures to relive pain and suffering. May use oxygen, suction and manual treatment of airway obstruction as needed for comfort.      01/18/19 0808        Code Status History    Date Active Date Inactive Code Status Order ID Comments User Context   01/16/2019 1910 01/18/2019 0808 Full Code [id number]  Salary, Evelena Asa, MD Inpatient      TOTAL TIME TAKING CARE OF THIS PATIENT: *40* minutes.    Enedina Finner M.D on 01/18/2019 at 8:09 AM  Between 7am to 6pm - Pager - [phone] After 6pm go to [email or web address] - password [email or web address]  [facility] [place] [facility]  Office  [phone]  CC: Primary care physician; Unice Bailey, MD

## 2019-01-18 NOTE — TOC Progression Note (Signed)
Transition of Care [facility]) - Progression Note    Patient Details  Name: Joan Cisneros MRN: [id number] Date of Birth: 10-18-1931  Transition of Care [facility]) CM/SW Contact  Ruthe Mannan, [place] Phone Number: 01/18/2019, 10:39 AM  Clinical Narrative:   Patient is a [facility] participant. CSW spoke with [place] at [facility] [phone]. Annice Pih requesting that a referral be sent to [facility]. CSW spoke with Neysa Bonito at [facility] [phone] and faxed referral to her. Neysa Bonito will evaluate if they can accept patient today. CSW will continue to follow for discharge planning.     Expected Discharge Plan: Skilled Nursing Facility Barriers to Discharge: Unsafe home situation, SNF Pending bed offer  Expected Discharge Plan and Services Expected Discharge Plan: Skilled Nursing Facility Discharge Planning Services: Other - See comment(SNF [facility] program) Post Acute Care Choice: Skilled Nursing Facility Living arrangements for the past 2 months: Single Family Home Expected Discharge Date: 01/18/19                         Social Determinants of Health (SDOH) Interventions    Readmission Risk Interventions 30 Day Unplanned Readmission Risk Score     ED to Hosp-Admission (Current) from 01/16/2019 in [facility] (1A)  30 Day Unplanned Readmission Risk Score (%)  9 Filed at 01/18/2019 0801     This score is the patient's risk of an unplanned readmission within 30 days of being discharged (0 -100%). The score is based on dignosis, age, lab data, medications, orders, and past utilization.   Low:  0-14.9   Medium: 15-21.9   High: 22-29.9   Extreme: 30 and above       No flowsheet data found.

## 2019-01-19 LAB — WET PREP, GENITAL
Clue Cells Wet Prep HPF POC: NONE SEEN
SPERM: NONE SEEN
Trich, Wet Prep: NONE SEEN
Yeast Wet Prep HPF POC: NONE SEEN

## 2019-01-19 LAB — RPR: RPR Ser Ql: NONREACTIVE

## 2019-01-19 NOTE — TOC Progression Note (Signed)
Transition of Care [facility]) - Progression Note    Patient Details  Name: Joan Cisneros MRN: [id number] Date of Birth: August 22, 1931  Transition of Care [facility]) CM/SW Contact  Ruthe Mannan, [place] Phone Number: 01/19/2019, 2:43 PM  Clinical Narrative:   CSW spoke with Gavin Pound at [facility] regarding admission. Gavin Pound states that they can not accept patients today but could accept patient tomorrow. CSW notified MD of above. Patient should DC tomorrow.     Expected Discharge Plan: Skilled Nursing Facility Barriers to Discharge: Unsafe home situation, SNF Pending bed offer  Expected Discharge Plan and Services Expected Discharge Plan: Skilled Nursing Facility Discharge Planning Services: Other - See comment(SNF [facility] program) Post Acute Care Choice: Skilled Nursing Facility Living arrangements for the past 2 months: Single Family Home Expected Discharge Date: 01/18/19                         Social Determinants of Health (SDOH) Interventions    Readmission Risk Interventions 30 Day Unplanned Readmission Risk Score     ED to Hosp-Admission (Current) from 01/16/2019 in [facility]A)  30 Day Unplanned Readmission Risk Score (%)  10 Filed at 01/19/2019 1200     This score is the patient's risk of an unplanned readmission within 30 days of being discharged (0 -100%). The score is based on dignosis, age, lab data, medications, orders, and past utilization.   Low:  0-14.9   Medium: 15-21.9   High: 22-29.9   Extreme: 30 and above       No flowsheet data found.

## 2019-01-19 NOTE — Discharge Summary (Signed)
[facility] Physicians - [place] at [facility]   PATIENT NAME: Joan Cisneros    MR#:  [id number]  DATE OF BIRTH:  [birth year]  DATE OF ADMISSION:  01/16/2019 ADMITTING PHYSICIAN: Joan Sol, MD  DATE OF DISCHARGE: 01/19/2019  PRIMARY CARE PHYSICIAN: Joan Bailey, MD    ADMISSION DIAGNOSIS:  Altered mental status, unspecified altered mental status type [R41.82]  DISCHARGE DIAGNOSIS:  acute encephalopathy suspected due to UTI progressive worsening dementia  SECONDARY DIAGNOSIS:  History reviewed. No pertinent past medical history.  HOSPITAL COURSE:  [name] a87 y.o.femalewith a known history per below was found down in her home with altered mental status, [facility], patient is poor historian due to dementia. patient is now being admitted for acute toxic metabolic encephalopathy secondary to urinary tract infection  *Acute toxic metabolic encephalopathy -Most likely exacerbated by UTI, compounded by dementia - aspiration/fall/skin care precautions while in house -received IV fluids for rehydration - No fever.  * UTI Empiric Rocephin--UC was not sent from ER -change to po keflex for 5 days  * hypokalemia Repleted with p.o. potassium  *mild dehydration Most likely secondary to poor p.o. intake Received IV fluids for rehydration  *Chronic hypothyroidism, unspecified Continue Synthroid right  *hypertension Stable Continue losartan  *Severe malnutrition in the context of chronic illness. Patient evaluated by Dr. dietitian. Continue dietitian recommendations.  physical therapy to see patient. Care management/social worker for discharge planning.   Patient will discharged to rehab. Social worker consultation noted. I spoke at length with Joan Cisneros patient's son he is in agreement with plan. The healthcare power of attorney. According to him patient is a no code DNR.   Dr.  Victory Cisneros from [facility] program aware spoke with her this morning.  Patient will go to [facility] today. CONSULTS OBTAINED:    DRUG ALLERGIES:  No Known Allergies  DISCHARGE MEDICATIONS:   Allergies as of 01/19/2019   No Known Allergies     Medication List    TAKE these medications   acetaminophen 500 MG tablet Commonly known as:  TYLENOL Take 1,000 mg by mouth 3 (three) times daily as needed for mild pain or fever.   cephALEXin 250 MG capsule Commonly known as:  KEFLEX Take 1 capsule (250 mg total) by mouth every 6 (six) hours for 5 days.   feeding supplement (ENSURE ENLIVE) Liqd Take 237 mLs by mouth 2 (two) times daily between meals.   levothyroxine 150 MCG tablet Commonly known as:  SYNTHROID, LEVOTHROID Take 150 mcg by mouth daily.   liothyronine 25 MCG tablet Commonly known as:  CYTOMEL Take 25 mcg by mouth daily.   losartan 100 MG tablet Commonly known as:  COZAAR Take 100 mg by mouth daily.   multivitamin with minerals Tabs tablet Take 1 tablet by mouth daily.   trolamine salicylate 10 % cream Commonly known as:  ASPERCREME Apply 1 application topically 4 (four) times daily as needed (knee pain).   Vitamin D (Ergocalciferol) 1.25 MG (50000 UT) Caps capsule Commonly known as:  DRISDOL Take 50,000 Units by mouth every 30 (thirty) days.       If you experience worsening of your admission symptoms, develop shortness of breath, life threatening emergency, suicidal or homicidal thoughts you must seek medical attention immediately by calling 911 or calling your MD immediately  if symptoms less severe.  You Must read complete instructions/literature along with all the possible adverse reactions/side effects for all the Medicines you  take and that have been prescribed to you. Take any new Medicines after you have completely understood and accept all the possible adverse reactions/side effects.   Please note  You were cared for by a hospitalist during  your hospital stay. If you have any questions about your discharge medications or the care you received while you were in the hospital after you are discharged, you can call the unit and asked to speak with the hospitalist on call if the hospitalist that took care of you is not available. Once you are discharged, your primary care physician will handle any further medical issues. Please note that NO REFILLS for any discharge medications will be authorized once you are discharged, as it is imperative that you return to your primary care physician (or establish a relationship with a primary care physician if you do not have one) for your aftercare needs so that they can reassess your need for medications and monitor your lab values. Today   SUBJECTIVE   Awake and alert. Baseline confusion present.  VITAL SIGNS:  Blood pressure (!) 172/81, pulse 88, temperature 97.8 F (36.6 C), temperature source Oral, resp. rate 18, height 5\' 4"  (1.626 m), weight 49 kg, SpO2 96 %.  I/O:    Intake/Output Summary (Last 24 hours) at 01/19/2019 0756 Last data filed at 01/18/2019 1900 Gross per 24 hour  Intake 0 ml  Output 250 ml  Net -250 ml    PHYSICAL EXAMINATION:  GENERAL:  85 y.o.-year-old patient lying in the bed with no acute distress. Thin cachectic EYES: Pupils equal, round, reactive to light and accommodation. No scleral icterus. Extraocular muscles intact.  HEENT: Head atraumatic, normocephalic. Oropharynx and nasopharynx clear.  NECK:  Supple, no jugular venous distention. No thyroid enlargement, no tenderness.  LUNGS: Normal breath sounds bilaterally, no wheezing, rales,rhonchi or crepitation. No use of accessory muscles of respiration.  CARDIOVASCULAR: S1, S2 normal. No murmurs, rubs, or gallops.  ABDOMEN: Soft, non-tender, non-distended. Bowel sounds present. No organomegaly or mass.  EXTREMITIES: No pedal edema, cyanosis, or clubbing.  NEUROLOGIC: Cranial nerves II through XII are intact. No  focal weakness. Sensation intact. Gait not checked.  PSYCHIATRIC: The patient is alert and awake with some baseline confusion SKIN: No obvious rash, lesion, or ulcer.   DATA REVIEW:   CBC  Recent Labs  Lab 01/16/19 1517  WBC 10.3  HGB 14.5  HCT 44.4  PLT 301    Chemistries  Recent Labs  Lab 01/16/19 1517 01/17/19 0501  NA 138 140  K 3.1* 3.9  CL 100 109  CO2 27 25  GLUCOSE 144* 143*  BUN 18 19  CREATININE 1.00 0.83  CALCIUM 11.1* 10.2  MG  --  2.0  AST 38  --   ALT 18  --   ALKPHOS 78  --   BILITOT 1.0  --     Microbiology Results   No results found for this or any previous visit (from the past 240 hour(s)).  RADIOLOGY:  No results found.   CODE STATUS:     Code Status Orders  (From admission, onward)         Start     Ordered   01/18/19 0809  Do not attempt resuscitation (DNR)  Continuous    Question Answer Comment  In the event of cardiac or respiratory ARREST Do not call a "code blue"   In the event of cardiac or respiratory ARREST Do not perform Intubation, CPR, defibrillation or ACLS   In the  event of cardiac or respiratory ARREST Use medication by any route, position, wound care, and other measures to relive pain and suffering. May use oxygen, suction and manual treatment of airway obstruction as needed for comfort.      01/18/19 0808        Code Status History    Date Active Date Inactive Code Status Order ID Comments User Context   01/16/2019 1910 01/18/2019 0808 Full Code [id number]  Salary, Evelena Asa, MD Inpatient      TOTAL TIME TAKING CARE OF THIS PATIENT: *40* minutes.    Enedina Finner M.D on 01/19/2019 at 7:56 AM  Between 7am to 6pm - Pager - [phone] After 6pm go to [email or web address] - password [employer]  [facility]  Office  [phone]  CC: Primary care physician; Joan Bailey, MD

## 2019-01-19 NOTE — Care Management Important Message (Signed)
Important Message  Patient Details  Name: CHIHIRO GANZER MRN: [id number] Date of Birth: 1931/09/13   Medicare Important Message Given:  Yes    Olegario Messier A Rahim Astorga 01/19/2019, 10:13 AM

## 2019-01-20 NOTE — Progress Notes (Signed)
Pt ready for discharge to SNF per MD. Pt assessment unchanged from this morning. Report called to [place] Citizen at [facility]; all questions answered and discharge instructions reviewed. [facility] will be providing transportation to facility. PIV removed, VSS.   Shelbie Ammons

## 2019-01-20 NOTE — Discharge Summary (Signed)
[facility] Physicians - [place] at [facility]   PATIENT NAME: Joan Cisneros    MR#:  [id number]  DATE OF BIRTH:  10-Nov-1930  DATE OF ADMISSION:  01/16/2019 ADMITTING PHYSICIAN: Bertrum Sol, MD  DATE OF DISCHARGE: 01/20/2019  PRIMARY CARE PHYSICIAN: Unice Bailey, MD    ADMISSION DIAGNOSIS:  Altered mental status, unspecified altered mental status type [R41.82]  DISCHARGE DIAGNOSIS:  acute encephalopathy suspected due to UTI progressive worsening dementia  SECONDARY DIAGNOSIS:  History reviewed. No pertinent past medical history.  HOSPITAL COURSE:  [name] a87 y.o.femalewith a known history per below was found down in her home with altered mental status, patient is under [facility] in the community, patient is poor historian due to dementia. patient is now being admitted for acute toxic metabolic encephalopathy secondary to urinary tract infection  *Acute toxic metabolic encephalopathy -Most likely exacerbated by UTI, compounded by dementia - aspiration/fall/skin care precautions while in house -received IV fluids for rehydration - No fever.  * UTI Empiric Rocephin--UC was not sent from ER -change to po keflex for 5 days  * hypokalemia Repleted with p.o. potassium  *mild dehydration Most likely secondary to poor p.o. intake Received IV fluids for rehydration  *Chronic hypothyroidism, unspecified Continue Synthroid right  *hypertension Stable Continue losartan  *Severe malnutrition in the context of chronic illness. Patient evaluated by Dr. dietitian. Continue dietitian recommendations.  physical therapy to see patient. Care management/social worker for discharge planning.   Patient will discharged to rehab. Social worker consultation noted.Mr. Joan Cisneros patient's son he is in agreement with plan. He is the healthcare power of attorney. According to him patient is a no code DNR.     Patient will go to  [facility] today. CONSULTS OBTAINED:    DRUG ALLERGIES:  No Known Allergies  DISCHARGE MEDICATIONS:   Allergies as of 01/20/2019   No Known Allergies     Medication List    TAKE these medications   acetaminophen 500 MG tablet Commonly known as:  TYLENOL Take 1,000 mg by mouth 3 (three) times daily as needed for mild pain or fever.   cephALEXin 250 MG capsule Commonly known as:  KEFLEX Take 1 capsule (250 mg total) by mouth every 6 (six) hours for 5 days.   feeding supplement (ENSURE ENLIVE) Liqd Take 237 mLs by mouth 2 (two) times daily between meals.   levothyroxine 150 MCG tablet Commonly known as:  SYNTHROID, LEVOTHROID Take 150 mcg by mouth daily.   liothyronine 25 MCG tablet Commonly known as:  CYTOMEL Take 25 mcg by mouth daily.   losartan 100 MG tablet Commonly known as:  COZAAR Take 100 mg by mouth daily.   multivitamin with minerals Tabs tablet Take 1 tablet by mouth daily.   trolamine salicylate 10 % cream Commonly known as:  ASPERCREME Apply 1 application topically 4 (four) times daily as needed (knee pain).   Vitamin D (Ergocalciferol) 1.25 MG (50000 UT) Caps capsule Commonly known as:  DRISDOL Take 50,000 Units by mouth every 30 (thirty) days.       If you experience worsening of your admission symptoms, develop shortness of breath, life threatening emergency, suicidal or homicidal thoughts you must seek medical attention immediately by calling 911 or calling your MD immediately  if symptoms less severe.  You Must read complete instructions/literature along with all the possible adverse reactions/side effects for all the Medicines you take and that have been prescribed to you. Take any new Medicines after you have  completely understood and accept all the possible adverse reactions/side effects.   Please note  You were cared for by a hospitalist during your hospital stay. If you have any questions about your discharge medications or the care  you received while you were in the hospital after you are discharged, you can call the unit and asked to speak with the hospitalist on call if the hospitalist that took care of you is not available. Once you are discharged, your primary care physician will handle any further medical issues. Please note that NO REFILLS for any discharge medications will be authorized once you are discharged, as it is imperative that you return to your primary care physician (or establish a relationship with a primary care physician if you do not have one) for your aftercare needs so that they can reassess your need for medications and monitor your lab values. Today   SUBJECTIVE   Awake and alert. Baseline confusion present.  VITAL SIGNS:  Blood pressure (!) 152/77, pulse 81, temperature 97.6 F (36.4 C), temperature source Oral, resp. rate 18, height 5\' 4"  (1.626 m), weight 53.1 kg, SpO2 97 %.  I/O:    Intake/Output Summary (Last 24 hours) at 01/20/2019 0854 Last data filed at 01/20/2019 0452 Gross per 24 hour  Intake -  Output 350 ml  Net -350 ml    PHYSICAL EXAMINATION:  GENERAL:  85 y.o.-year-old patient lying in the bed with no acute distress. Thin cachectic EYES: Pupils equal, round, reactive to light and accommodation. No scleral icterus. Extraocular muscles intact.  HEENT: Head atraumatic, normocephalic. Oropharynx and nasopharynx clear.  NECK:  Supple, no jugular venous distention. No thyroid enlargement, no tenderness.  LUNGS: Normal breath sounds bilaterally, no wheezing, rales,rhonchi or crepitation. No use of accessory muscles of respiration.  CARDIOVASCULAR: S1, S2 normal. No murmurs, rubs, or gallops.  ABDOMEN: Soft, non-tender, non-distended. Bowel sounds present. No organomegaly or mass.  EXTREMITIES: No pedal edema, cyanosis, or clubbing.  NEUROLOGIC: Cranial nerves II through XII are intact. No focal weakness. Sensation intact. Gait not checked.  PSYCHIATRIC: The patient is alert and  awake with some baseline confusion SKIN: No obvious rash, lesion, or ulcer.   DATA REVIEW:   CBC  Recent Labs  Lab 01/16/19 1517  WBC 10.3  HGB 14.5  HCT 44.4  PLT 301    Chemistries  Recent Labs  Lab 01/16/19 1517 01/17/19 0501  NA 138 140  K 3.1* 3.9  CL 100 109  CO2 27 25  GLUCOSE 144* 143*  BUN 18 19  CREATININE 1.00 0.83  CALCIUM 11.1* 10.2  MG  --  2.0  AST 38  --   ALT 18  --   ALKPHOS 78  --   BILITOT 1.0  --     Microbiology Results   Recent Results (from the past 240 hour(s))  Wet prep, genital     Status: Abnormal   Collection Time: 01/19/19  9:52 AM  Result Value Ref Range Status   Yeast Wet Prep HPF POC NONE SEEN NONE SEEN Final   Trich, Wet Prep NONE SEEN NONE SEEN Final   Clue Cells Wet Prep HPF POC NONE SEEN NONE SEEN Final   WBC, Wet Prep HPF POC MANY (A) NONE SEEN Final   Sperm NONE SEEN  Final    Comment: Performed at [facility], [address], [place], [place] [address]    RADIOLOGY:  No results found.   CODE STATUS:     Code Status Orders  (  From admission, onward)         Start     Ordered   01/18/19 0809  Do not attempt resuscitation (DNR)  Continuous    Question Answer Comment  In the event of cardiac or respiratory ARREST Do not call a "code blue"   In the event of cardiac or respiratory ARREST Do not perform Intubation, CPR, defibrillation or ACLS   In the event of cardiac or respiratory ARREST Use medication by any route, position, wound care, and other measures to relive pain and suffering. May use oxygen, suction and manual treatment of airway obstruction as needed for comfort.      01/18/19 0808        Code Status History    Date Active Date Inactive Code Status Order ID Comments User Context   01/16/2019 1910 01/18/2019 0808 Full Code [id number]  Salary, Evelena Asa, MD Inpatient      TOTAL TIME TAKING CARE OF THIS PATIENT: *40* minutes.    Enedina Finner M.D on 01/20/2019 at 8:54 AM  Between 7am to  6pm - Pager - [phone] After 6pm go to [email or web address] - password [employer]  [facility] [place] [facility]  Office  [phone]  CC: Primary care physician; Unice Bailey, MD

## 2019-01-20 NOTE — TOC Transition Note (Signed)
Transition of Care [facility]) - CM/SW Discharge Note   Patient Details  Name: ZEPHANIAH HOWREN MRN: [id number] Date of Birth: [birth year]  Transition of Care [facility]) CM/SW Contact:  Ruthe Mannan, LCSWA Phone Number: 01/20/2019, 9:02 AM   Clinical Narrative:   Patient is medically ready for discharge today to [facility]. Patient aware of discharge today. CSW notified patient's daughter Blima Rich [phone] of discharge today. CSW also notified Trudy at [facility] of discharge today. Damian Leavell states that [facility] will transport patient. CSW notified Gavin Pound at [facility] of discharge today. RN will call report.     Final next level of care: Skilled Nursing Facility Barriers to Discharge: No Barriers Identified   Patient Goals and CMS Choice Patient states their goals for this hospitalization and ongoing recovery are:: Pt states that she wants to take care of herself and wants to be able to enjoy a "hot cup of tea."  CMS Medicare.gov Compare Post Acute Care list provided to:: Patient Represenative (must comment)(Daughter- Blima Rich) Choice offered to / list presented to : Adult Children(Daughter- Blima Rich )  Discharge Placement   Existing PASRR number confirmed : 01/17/19          Patient chooses bed at: [facility] Patient to be transferred to facility by: [facility] Name of family member notified: Blima Rich- daughter  Patient and family notified of of transfer: 01/20/19  Discharge Plan and Services Discharge Planning Services: Other - See comment(SNF [facility]) Post Acute Care Choice: Skilled Nursing Facility                    Social Determinants of Health (SDOH) Interventions     Readmission Risk Interventions No flowsheet data found.

## 2019-03-05 DEATH — deceased

## 2020-06-08 IMAGING — US US PELVIS COMPLETE TRANSABD/TRANSVAG
1 series · 13 of 25 positions shown · non-contrast
Comparison: None available

CLINICAL DATA: Initial evaluation for abnormal uterine bleeding.



[Series 1: us pelvis complete transabd/transvag · 0.26mm/px · 55 acquisitions, 13 frames shown]
[im 1/55]
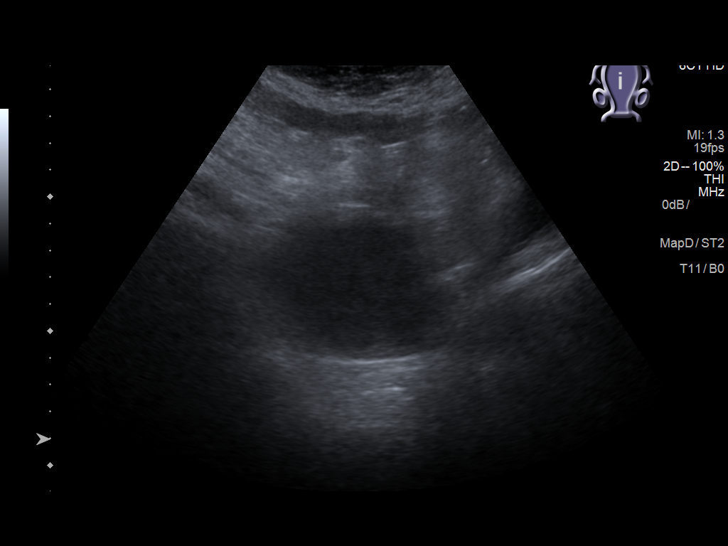
[im 5/55]
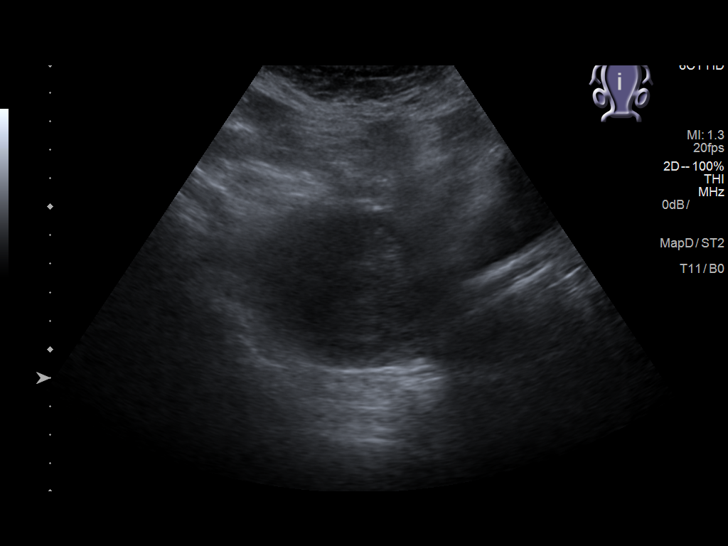
[im 10/55]
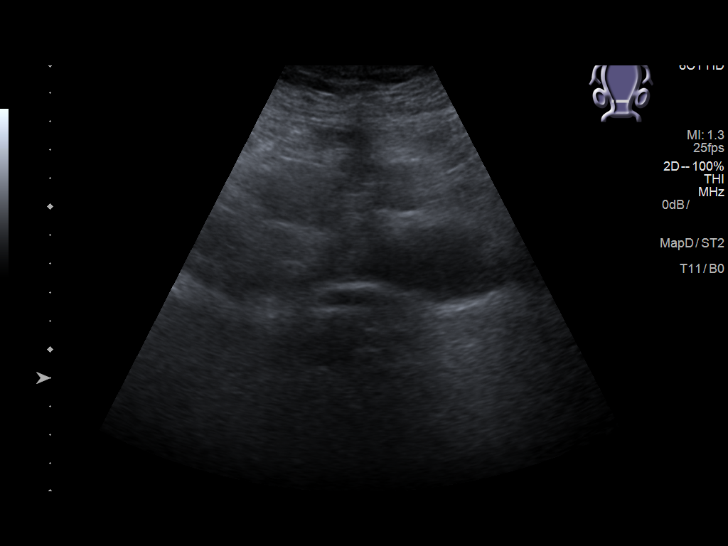
[im 14/55]
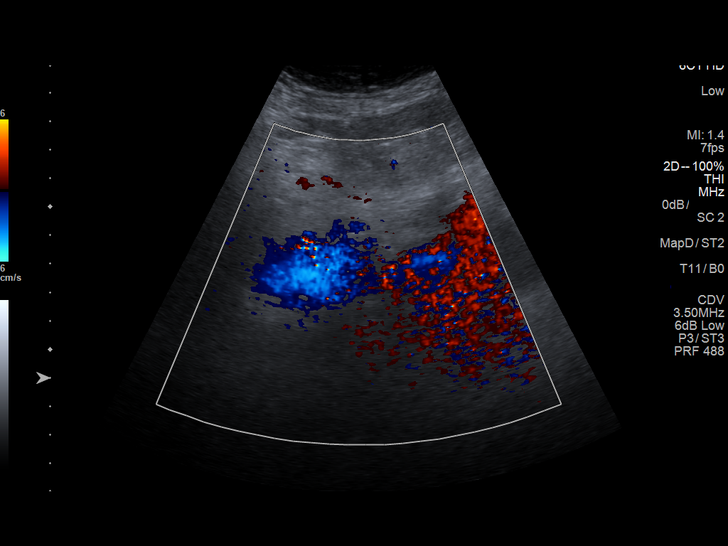
[im 19/55]
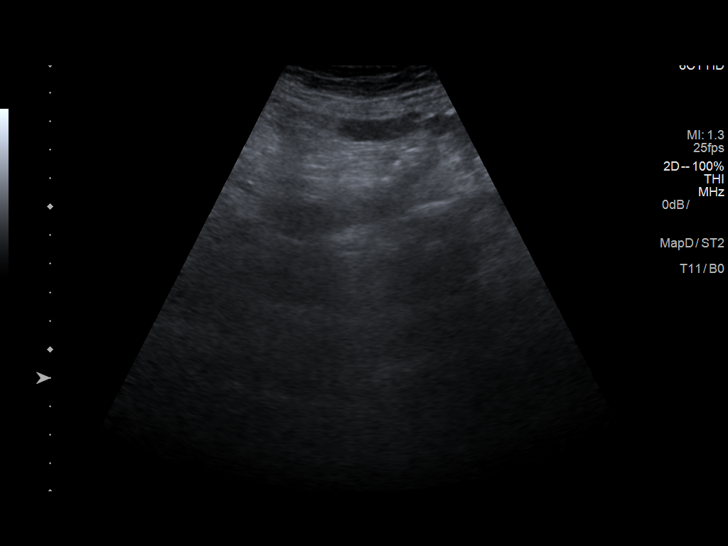
[im 23/55]
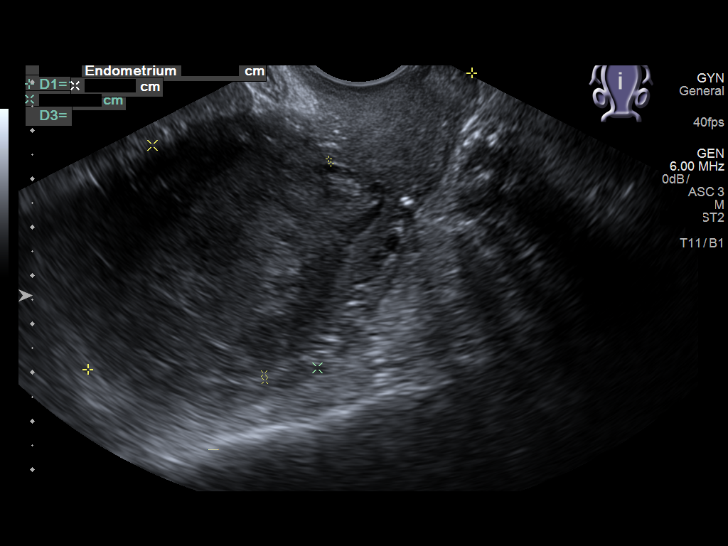
[im 28/55]
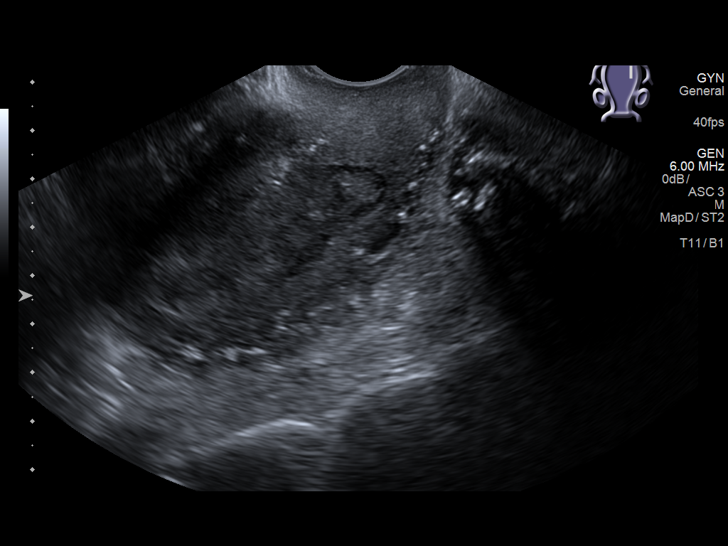
[im 32/55]
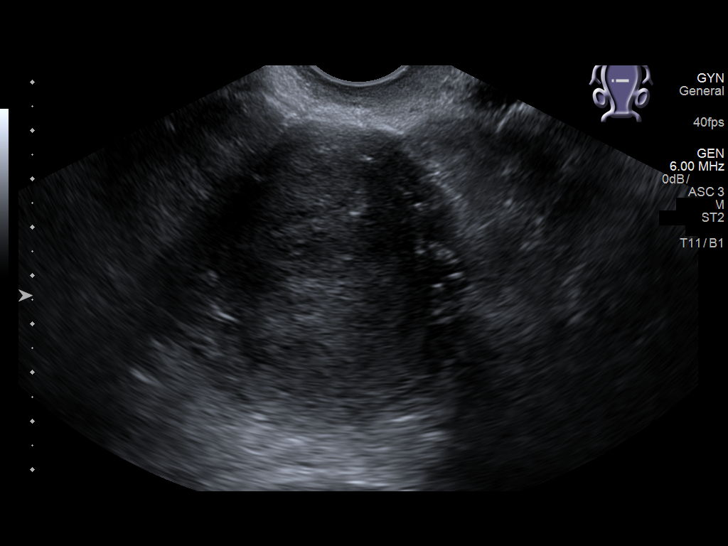
[im 37/55]
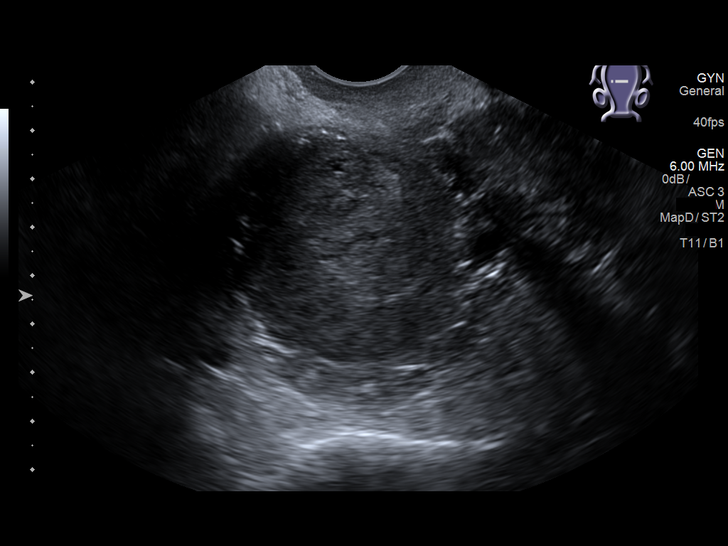
[im 41/55]
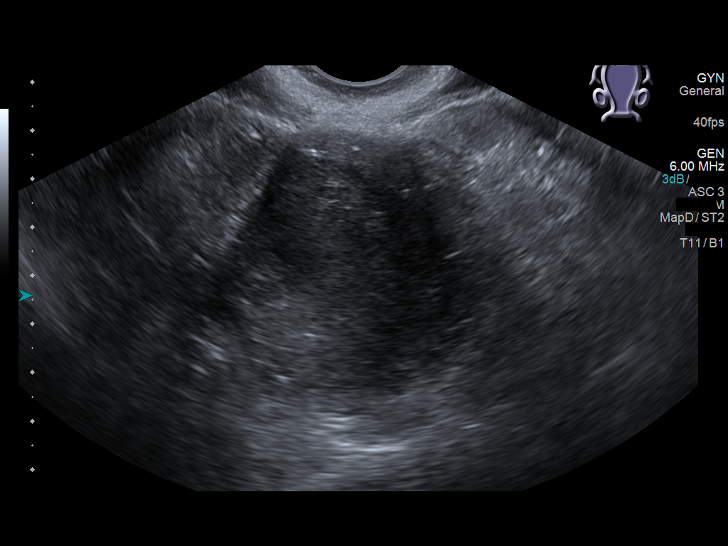
[im 46/55]
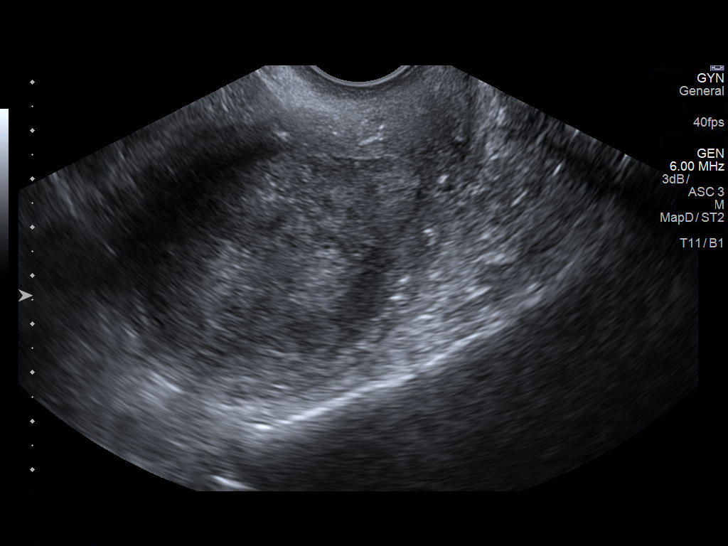
[im 50/55]
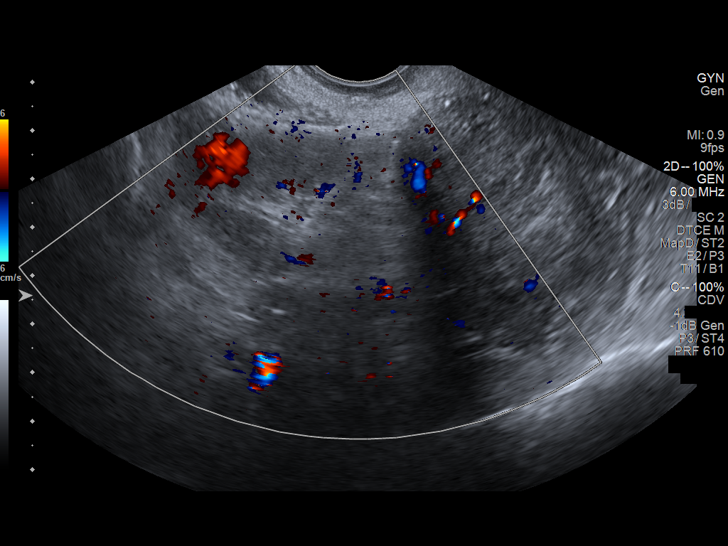
[im 55/55]
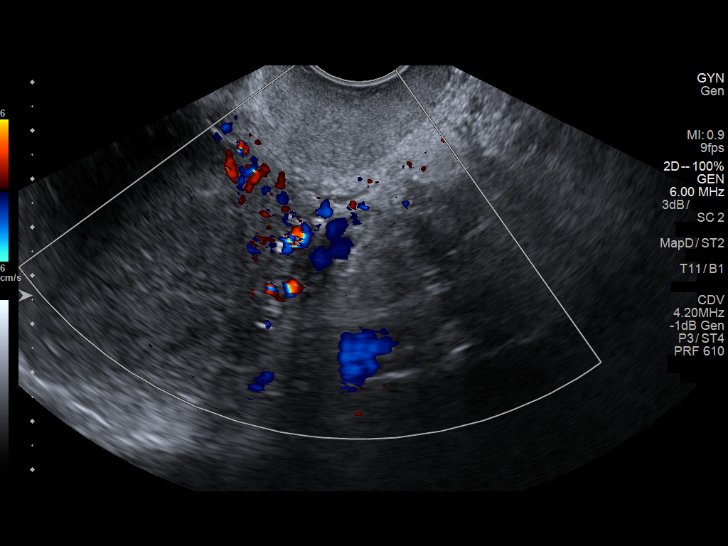

[13 of 25 positions shown; findings below may reference images not displayed]

FINDINGS: Uterus

Measurements: 10.0 x 5.7 x 6.8 cm = volume: 204 mL. No fibroids or
other mass visualized. Prominent vascular calcifications noted
within the myometrium.

Endometrium

Heterogeneous echogenic material present within the endometrial
lumen, measuring up to approximately 4 cm in maximal thickness.
Unclear whether this reflects the thickened endometrial stripe
itself or possibly an endometrial mass and/or blood products. No
significant associated vascularity. No other focal lesion
identified.

Right ovary

Not visualized.  No adnexal mass.

Left ovary

Not visualized.  No adnexal mass.

Other findings

No abnormal free fluid.
IMPRESSION: 1. Heterogeneous echogenic material within the endometrial cavity
measuring up to approximately 4 cm in maximal thickness.Unclear
whether this reflects the thickened endometrial stripe itself or
possibly an endometrial mass and/or blood products. In the setting
of post-menopausal bleeding, endometrial sampling is indicated to
exclude carcinoma. If results are benign, sonohysterogram should be
considered for focal lesion work-up. (Ref: Radiological Reasoning:
Algorithmic Workup of Abnormal Vaginal Bleeding with Endovaginal
Sonography and Sonohysterography. AJR 2220; 191:S68-73).
2. Nonvisualization of the ovaries.  No adnexal mass or free fluid.
